# Patient Record
Sex: Male | Born: 1965 | Race: Black or African American | Hispanic: No | Marital: Married | Smoking: Never smoker
Health system: Southern US, Community
[De-identification: ages and names within clinical notes are randomized; demographics above are authoritative.]

## PROBLEM LIST (undated history)

## (undated) DIAGNOSIS — I82409 Acute embolism and thrombosis of unspecified deep veins of unspecified lower extremity: Secondary | ICD-10-CM

## (undated) DIAGNOSIS — I2699 Other pulmonary embolism without acute cor pulmonale: Secondary | ICD-10-CM

## (undated) DIAGNOSIS — I10 Essential (primary) hypertension: Secondary | ICD-10-CM

---

## 2018-01-08 ENCOUNTER — Inpatient Hospital Stay (HOSPITAL_COMMUNITY)
Admission: EM | Admit: 2018-01-08 | Discharge: 2018-01-10 | DRG: 176 | Disposition: A | Discharge status: Home / Self Care | Payer: 59 | Attending: Family Medicine | Admitting: Family Medicine

## 2018-01-08 ENCOUNTER — Emergency Department (HOSPITAL_COMMUNITY): Payer: 59

## 2018-01-08 ENCOUNTER — Other Ambulatory Visit: Payer: Self-pay

## 2018-01-08 ENCOUNTER — Encounter (HOSPITAL_COMMUNITY): Payer: Self-pay | Admitting: Emergency Medicine

## 2018-01-08 DIAGNOSIS — R000 Tachycardia, unspecified: Secondary | ICD-10-CM | POA: Diagnosis not present

## 2018-01-08 DIAGNOSIS — O2230 Deep phlebothrombosis in pregnancy, unspecified trimester: Secondary | ICD-10-CM

## 2018-01-08 DIAGNOSIS — I272 Pulmonary hypertension, unspecified: Secondary | ICD-10-CM | POA: Diagnosis present

## 2018-01-08 DIAGNOSIS — R079 Chest pain, unspecified: Secondary | ICD-10-CM | POA: Diagnosis not present

## 2018-01-08 DIAGNOSIS — R0989 Other specified symptoms and signs involving the circulatory and respiratory systems: Secondary | ICD-10-CM | POA: Diagnosis not present

## 2018-01-08 DIAGNOSIS — I2602 Saddle embolus of pulmonary artery with acute cor pulmonale: Principal | ICD-10-CM | POA: Diagnosis present

## 2018-01-08 DIAGNOSIS — I824Y9 Acute embolism and thrombosis of unspecified deep veins of unspecified proximal lower extremity: Secondary | ICD-10-CM

## 2018-01-08 DIAGNOSIS — Z8249 Family history of ischemic heart disease and other diseases of the circulatory system: Secondary | ICD-10-CM

## 2018-01-08 DIAGNOSIS — I2692 Saddle embolus of pulmonary artery without acute cor pulmonale: Secondary | ICD-10-CM | POA: Diagnosis present

## 2018-01-08 DIAGNOSIS — R0902 Hypoxemia: Secondary | ICD-10-CM | POA: Diagnosis not present

## 2018-01-08 DIAGNOSIS — I82431 Acute embolism and thrombosis of right popliteal vein: Secondary | ICD-10-CM | POA: Diagnosis present

## 2018-01-08 DIAGNOSIS — N200 Calculus of kidney: Secondary | ICD-10-CM | POA: Diagnosis not present

## 2018-01-08 DIAGNOSIS — R0789 Other chest pain: Secondary | ICD-10-CM | POA: Diagnosis not present

## 2018-01-08 LAB — I-STAT TROPONIN, ED: Troponin i, poc: 0.1 ng/mL (ref 0.00–0.08)

## 2018-01-08 LAB — I-STAT CHEM 8, ED
BUN: 18 mg/dL (ref 6–20)
CALCIUM ION: 0.98 mmol/L — AB (ref 1.15–1.40)
CHLORIDE: 106 mmol/L (ref 98–111)
Creatinine, Ser: 1.3 mg/dL — ABNORMAL HIGH (ref 0.61–1.24)
GLUCOSE: 177 mg/dL — AB (ref 70–99)
HCT: 50 % (ref 39.0–52.0)
Hemoglobin: 17 g/dL (ref 13.0–17.0)
POTASSIUM: 5 mmol/L (ref 3.5–5.1)
Sodium: 138 mmol/L (ref 135–145)
TCO2: 21 mmol/L — ABNORMAL LOW (ref 22–32)

## 2018-01-08 MED ORDER — HEPARIN BOLUS VIA INFUSION
5500.0000 [IU] | Freq: Once | INTRAVENOUS | Status: AC
  Administered 2018-01-08: 5500 [IU] via INTRAVENOUS
  Filled 2018-01-08: qty 5500

## 2018-01-08 MED ORDER — FENTANYL CITRATE (PF) 100 MCG/2ML IJ SOLN
50.0000 ug | Freq: Once | INTRAMUSCULAR | Status: AC
  Administered 2018-01-08: 50 ug via INTRAVENOUS
  Filled 2018-01-08: qty 2

## 2018-01-08 MED ORDER — NITROGLYCERIN 0.4 MG SL SUBL
0.4000 mg | SUBLINGUAL_TABLET | SUBLINGUAL | Status: DC | PRN
  Filled 2018-01-08: qty 1

## 2018-01-08 MED ORDER — IOPAMIDOL (ISOVUE-370) INJECTION 76%
INTRAVENOUS | Status: AC
  Filled 2018-01-08: qty 100

## 2018-01-08 MED ORDER — IOPAMIDOL (ISOVUE-370) INJECTION 76%
100.0000 mL | Freq: Once | INTRAVENOUS | Status: AC | PRN
  Administered 2018-01-08: 100 mL via INTRAVENOUS

## 2018-01-08 MED ORDER — HEPARIN (PORCINE) IN NACL 100-0.45 UNIT/ML-% IJ SOLN
1550.0000 [IU]/h | INTRAMUSCULAR | Status: DC
  Administered 2018-01-08: 1500 [IU]/h via INTRAVENOUS
  Administered 2018-01-09 – 2018-01-10 (×2): 1550 [IU]/h via INTRAVENOUS
  Filled 2018-01-08 (×3): qty 250

## 2018-01-08 NOTE — ED Notes (Signed)
Code stemi canceled per Dr Silverio LayYao / Dr Herbie BaltimoreHarding

## 2018-01-08 NOTE — Consult Note (Addendum)
Name: Christopher Coffey MRN: [id number] DOB: June 12, 1966    ADMISSION DATE:  01/08/2018 CONSULTATION DATE: January 08, 2018  REFERRING MD : Hospitalist service  CHIEF COMPLAINT: Lightheadedness dizziness chest pain not feeling well  BRIEF PATIENT DESCRIPTION: 52 year old with saddle submassive PE chest pain hemodynamically stable about to start on heparin unprovoked PE no family history no clear poor precipitating factors  SIGNIFICANT EVENTS  Submassive PE saddle PE with [id number] no tachycardia no hypoxia no hypotension    HISTORY OF PRESENT ILLNESS: This is a 52 year old African-American male with no past medical history presented with left-sided chest pain the patient is saying that he has baseline health status and he has a desk job for the past 3 years nothing changed in his routine no recent sedentary lifestyle limb injury or new medications weight loss weight gain fever chills rigors no nausea vomiting diarrhea and did not think much of it but today he felt like that chest pain not feeling well came into the ED and EMS truck he was briefly hypotensive and he was given 250 cc bolus saline but he got here his blood pressure was stable he does not take any medications he denies alcohol drugs or smoking.  Denies with complete gain or weight loss no family history of blood clots.  PAST MEDICAL HISTORY :   has no past medical history on file.  has no past surgical history on file. Prior to Admission medications   Not on File   No Known Allergies  FAMILY HISTORY:  family history is not on file. SOCIAL HISTORY:  reports that he has never smoked. He has never used smokeless tobacco. He reports that he drinks alcohol. He reports that he does not use drugs.  REVIEW OF SYSTEMS:   Constitutional: Negative for fever, chills, weight loss, malaise/fatigue and diaphoresis.  HENT: Negative for hearing loss, ear pain, nosebleeds, congestion, sore throat, neck pain, tinnitus and ear discharge.   Eyes:  Negative for blurred vision, double vision, photophobia, pain, discharge and redness.  Respiratory: Negative for cough, hemoptysis, sputum production, shortness of breath, wheezing and stridor.   Cardiovascular: Negative for chest pain, palpitations, orthopnea, claudication, leg swelling and PND.  Gastrointestinal: Negative for heartburn, nausea, vomiting, abdominal pain, diarrhea, constipation, blood in stool and melena.  Genitourinary: Negative for dysuria, urgency, frequency, hematuria and flank pain.  Musculoskeletal: Negative for myalgias, back pain, joint pain and falls.  Skin: Negative for itching and rash.  Neurological: Negative for dizziness, tingling, tremors, sensory change, speech change, focal weakness, seizures, loss of consciousness, weakness and headaches.  Endo/Heme/Allergies: Negative for environmental allergies and polydipsia. Does not bruise/bleed easily.  SUBJECTIVE:   VITAL SIGNS: Temp:  [97.7 F (36.5 C)] 97.7 F (36.5 C) (07/11 2218) Pulse Rate:  [94-103] 94 (07/11 2330) Resp:  [15-27] 15 (07/11 2330) BP: (119-135)/(85-97) 129/87 (07/11 2330) SpO2:  [94 %-100 %] 98 % (07/11 2330) Weight:  [210 lb (95.3 kg)] 210 lb (95.3 kg) (07/11 2214)  PHYSICAL EXAMINATION: General: Mild acute distress from pain, pleasant and hungry , wants to eat . Neuro:  WNL , AOX3 , EOMI , CN II-XII intact , UL , LL strength is symmetrical and 5/5 HEENT:  atraumatic , no jaundice , dry mucous membranes  Cardiovascular:  Irregular irregular , ESM 2/6 in the aortic area  Lungs:  CTA bilateral , no wheezing or crackles  Abdomen:  Soft lax +BS , no tenderness . Musculoskeletal:  WNL , normal pulses  Skin:  No rash  Recent Labs  Lab 01/08/18 2229  NA 138  K 5.0  CL 106  BUN 18  CREATININE 1.30*  GLUCOSE 177*   Recent Labs  Lab 01/08/18 2220 01/08/18 2229  HGB 15.7 17.0  HCT 49.9 50.0  WBC 5.8  --   PLT PENDING  --    Dg Chest Port 1 View  Result Date:  01/08/2018 CLINICAL DATA:  Acute onset crushing chest pain beginning 45 minutes ago. EXAM: PORTABLE CHEST 1 VIEW COMPARISON:  None. FINDINGS: Cardiomediastinal silhouette is normal. Pulmonary vascular congestion. No pleural effusions or focal consolidations. Trachea projects midline and there is no pneumothorax. Soft tissue planes and included osseous structures are non-suspicious. IMPRESSION: Pulmonary vascular congestion. Electronically Signed   By: Awilda Metro M.D.   On: 01/08/2018 22:31   Ct Angio Chest/abd/pel For Dissection W And/or Wo Contrast  Result Date: 01/08/2018 CLINICAL DATA:  Sudden onset crushing chest pain EXAM: CT ANGIOGRAPHY CHEST, ABDOMEN AND PELVIS TECHNIQUE: Multidetector CT imaging through the chest, abdomen and pelvis was performed using the standard protocol during bolus administration of intravenous contrast. Multiplanar reconstructed images and MIPs were obtained and reviewed to evaluate the vascular anatomy. CONTRAST:  ISOVUE-370 IOPAMIDOL (ISOVUE-370) INJECTION 76% COMPARISON:  None. FINDINGS: CTA CHEST FINDINGS Cardiovascular: Acute saddle pulmonary embolism with extensive lobar and segmental vascular narrowing. RV to LV ratio 1.1. No cardiomegaly or pericardial effusion. Normal appearance of the aorta. Mediastinum/Nodes: Negative for adenopathy Lungs/Pleura: Negative for edema or infarct Musculoskeletal: Negative Review of the MIP images confirms the above findings. CTA ABDOMEN AND PELVIS FINDINGS VASCULAR Aorta: Smooth normal appearance Celiac: Standard branching with normal appearing vessels. SMA: Smooth normal appearance Renals: Single bilateral renal arteries with earlier branching on the left. Mild undulation of the left inferior branch without convincing beading for fibromuscular dysplasia. IMA: Patent. Inflow: Normal Veins: Negative in the arterial phase Review of the MIP images confirms the above findings. NON-VASCULAR Hepatobiliary: No focal liver  abnormality.No evidence of biliary obstruction or stone. Pancreas: Unremarkable. Spleen: Unremarkable. Adrenals/Urinary Tract: Negative adrenals. Two left lower pole calculi measuring up to 4 mm. No hydronephrosis. Unremarkable bladder. Stomach/Bowel:  No obstruction. No inflammatory changes Lymphatic: No mass or adenopathy. Reproductive:No pathologic findings. Other: No ascites or pneumoperitoneum. Musculoskeletal: No acute abnormalities. Critical Value/emergent results were called by telephone at the time of interpretation on 01/08/2018 at 11:03 pm to Dr. Chaney Malling , who verbally acknowledged these results. Review of the MIP images confirms the above findings. IMPRESSION: 1. Acute saddle pulmonary embolism with right heart strain (RV to LV ratio 1.1) consistent with at least submassive (intermediate risk) PE. The presence of right heart strain has been associated with an increased risk of morbidity and mortality. Consider activation of code PE by paging [phone]. 2. No pulmonary edema or infarct. 3. Normal CTA of the aorta. 4. Left nephrolithiasis. Electronically Signed   By: Marnee Spring M.D.   On: 01/08/2018 23:16    ASSESSMENT / PLAN:  --52 year old with no previous past medical history no risk factors for PE presents with unprovoked submassive saddle PE with a transient episode of hypotension lightheadedness RV to LV ratio 1.1 no tachycardia no hypoxia no tachypnea. This is all most likely related to genetic factors I cannot see very clear provoking precipitating factor at this point I recommend to continue with medical treatment also known as heparin which was initiated by the hospitalist service will continue with that for a 24 to 48 hours and switch him to oral anticoagulation like Xarelto or  Eliquis for 6 months and have him follow-up as an outpatient with hematology and to decide if he is going to be lifelong chemotherapy or six-month therapy given that the acute severe presentation I  recommend to continue with the treatment for lifelong.  Work-up could be done when he is off the medication for 2 to 3 weeks in few months this point I do not recommend sending any of the work-up since the patient already started on heparin and he has massive clot burden which consumes clotting factors.  Echo has been ordered.   --Patient can go to stepdown unit with close follow-up.  Pulmonary and Critical Care Medicine [facility] Pager: [phone]  01/08/2018, 11:55 PM

## 2018-01-08 NOTE — ED Triage Notes (Signed)
Pt BIB [facility] with sudden onset crushing chest pain that started 45 minutes PTA. Pt reports pain radiated to left arm, back, and abdomen. Pt cool and clammy, on arrival, given 324mg  aspirin PTA. Pt c/o dull pain at this time.

## 2018-01-08 NOTE — Consult Note (Addendum)
INTERVENTIONAL CARDIOLOGY CONSULT  Reason for Consult: abnormal ECG   Requesting Physician/Service: ED YAO   PCP:  No primary care provider on file. 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  In route, ECG with ST elevations in V1 and code STEMI was activated.  Upon discussion, he notes for the last few days he has had some lightheadedness.  Today he had severe episode of lightheadedness with chest pressure.  This pressure radiated to his back to some degree and up to his shoulder blades.  In the emergency room, repeat ECG showed improvement in ST elevations in V1.  He still did have some diffuse mild ST depressions.  Given the history, he was sent to CT scan looking for aortic dissection.  However, it was revealed that he had a saddle pulmonary embolism.  Creatinine 1.3.  Cardiac markers pending.  In route, his pain symptoms improved somewhat with fluids and nitroglycerin.  After returning from the [place], he has had return of chest pain.  We repeated ECG which still looks improved from the initial one with ST elevations.    EDP is contacting critical care given large burden pulmonary embolism with hypotension and near syncope.  Previous Cardiac Studies: N/A  History reviewed. No pertinent past medical history. Possibly HTN - NOT ON MEDS  History reviewed. No pertinent surgical history.  No family history on file. He reports that his mother has HTN  Social History:  reports that he has never smoked. He has never used smokeless tobacco. He reports that he drinks alcohol. He reports that he does not use drugs.  Allergies: No Known Allergies  No current facility-administered medications on file prior to encounter.    No current outpatient medications on file  prior to encounter.  -- He does not take standing medications  Results for orders placed or performed during the hospital encounter of 01/08/18 (from the past 48 hour(s))  I-stat troponin, ED     Status: Abnormal   Collection Time: 01/08/18 10:28 PM  Result Value Ref Range   Troponin i, poc 0.10 (HH) 0.00 - 0.08 ng/mL   Comment NOTIFIED PHYSICIAN    Comment 3            Comment: Due to the release kinetics of cTnI, a negative result within the first hours of the onset of symptoms does not rule out myocardial infarction with certainty. If myocardial infarction is still suspected, repeat the test at appropriate intervals.   I-stat chem 8, ed     Status: Abnormal   Collection Time: 01/08/18 10:29 PM  Result Value Ref Range   Sodium 138 135 - 145 mmol/L   Potassium 5.0 3.5 - 5.1 mmol/L   Chloride 106 98 - 111 mmol/L   BUN 18 6 - 20 mg/dL   Creatinine, Ser 1.30 (H) 0.61 - 1.24 mg/dL   Glucose, Bld 177 (H) 70 - 99 mg/dL   Calcium, Ion 0.98 (L) 1.15 - 1.40 mmol/L   TCO2 21 (L) 22 - 32 mmol/L   Hemoglobin 17.0 13.0 - 17.0 g/dL   HCT 50.0 39.0 - 52.0 %   Dg Chest Port 1 View  Result Date: 01/08/2018 CLINICAL DATA:  Acute onset crushing chest pain beginning 45 minutes ago. EXAM: PORTABLE CHEST 1 VIEW COMPARISON:  None. FINDINGS: Cardiomediastinal silhouette is normal. Pulmonary vascular  congestion. No pleural effusions or focal consolidations. Trachea projects midline and there is no pneumothorax. Soft tissue planes and included osseous structures are non-suspicious. IMPRESSION: Pulmonary vascular congestion. Electronically Signed   By: Elon Alas M.D.   On: 01/08/2018 22:31   Ct Angio Chest/abd/pel For Dissection W And/or Wo Contrast  Result Date: 01/08/2018 CLINICAL DATA:  Sudden onset crushing chest pain EXAM: CT ANGIOGRAPHY CHEST, ABDOMEN AND PELVIS TECHNIQUE: Multidetector CT imaging through the chest, abdomen and pelvis was performed using the standard protocol during bolus  administration of intravenous contrast. Multiplanar reconstructed images and MIPs were obtained and reviewed to evaluate the vascular anatomy. CONTRAST:  124m ISOVUE-370 IOPAMIDOL (ISOVUE-370) INJECTION 76% COMPARISON:  None. FINDINGS: CTA CHEST FINDINGS Cardiovascular: Acute saddle pulmonary embolism with extensive lobar and segmental vascular narrowing. RV to LV ratio 1.1. No cardiomegaly or pericardial effusion. Normal appearance of the aorta. Mediastinum/Nodes: Negative for adenopathy Lungs/Pleura: Negative for edema or infarct Musculoskeletal: Negative Review of the MIP images confirms the above findings. CTA ABDOMEN AND PELVIS FINDINGS VASCULAR Aorta: Smooth normal appearance Celiac: Standard branching with normal appearing vessels. SMA: Smooth normal appearance Renals: Single bilateral renal arteries with earlier branching on the left. Mild undulation of the left inferior branch without convincing beading for fibromuscular dysplasia. IMA: Patent. Inflow: Normal Veins: Negative in the arterial phase Review of the MIP images confirms the above findings. NON-VASCULAR Hepatobiliary: No focal liver abnormality.No evidence of biliary obstruction or stone. Pancreas: Unremarkable. Spleen: Unremarkable. Adrenals/Urinary Tract: Negative adrenals. Two left lower pole calculi measuring up to 4 mm. No hydronephrosis. Unremarkable bladder. Stomach/Bowel:  No obstruction. No inflammatory changes Lymphatic: No mass or adenopathy. Reproductive:No pathologic findings. Other: No ascites or pneumoperitoneum. Musculoskeletal: No acute abnormalities. Critical Value/emergent results were called by telephone at the time of interpretation on 01/08/2018 at 11:03 pm to Dr. DShirlyn Goltz, who verbally acknowledged these results. Review of the MIP images confirms the above findings. IMPRESSION: 1. Acute saddle pulmonary embolism with right heart strain (RV to LV ratio 1.1) consistent with at least submassive (intermediate risk) PE. The  presence of right heart strain has been associated with an increased risk of morbidity and mortality. Consider activation of code PE by paging [phone] 2. No pulmonary edema or infarct. 3. Normal CTA of the aorta. 4. Left nephrolithiasis. Electronically Signed   By: JMonte FantasiaM.D.   On: 01/08/2018 23:16    ECG/TELE: Initial 1 and EMS with ST elevations in V1.  Subsequently sinus tachycardia with mild diffuse ST depressions without ST elevations.  ROS: As above. Otherwise, review of systems is negative unless per above HPI  Vitals:   01/08/18 2214 01/08/18 2217 01/08/18 2218 01/08/18 2230  BP:  (!) 129/92  119/87  Pulse:  99  (!) 103  Resp:  (!) 27  (!) 24  Temp:   97.7 F (36.5 C)   TempSrc:   Oral   SpO2:  100%  95%  Weight: 95.3 kg (210 lb)     Height: '6\' 1"'  (1.854 m)      Wt Readings from Last 10 Encounters:  01/08/18 95.3 kg (210 lb)    PE:  General: No acute distress (but per EMS was pale & diaphoretic with near syncope upon arrival) HEENT: Atraumatic, EOMI, mucous membranes moist. No JVD at 45 degrees. No HJR. CV: tachycardic regular no murmurs, gallops.  Respiratory: Clear, no crackles. Normal work of breathing ABD: Non-distended and non-tender. No palpable organomegaly.  Extremities: 2+ radial pulses bilaterally. 0 edema. Neuro/Psych: CN  grossly intact, alert and oriented  Assessment/Plan ECG changes Saddle Pulmonary Embolism  We are initially contacted given significant chest pain syndrome with lightheadedness and ST elevations in V1 with subtle changes in V2.  While we were certainly worried he was having dynamic ST elevations, given his history of chest pain rating to his back likely a CT scan was done which revealed a large burden pulmonary embolism.  Troponin has now resulted which is mildly elevated.  Current ECG with mild diffuse depressions and no elevations.  We agree with the emergency room contacting critical care for management.  At this point in  time, would proceed with treatment of PE.  Recommend echocardiogram looking for RV strain.  We are happy to continue to follow him as needed.  Seen with Dr. Ellyn Hack.   Lolita Cram Means  MD 01/08/2018, 11:23 PM   I have seen, examined and evaluated the patient along with Dr. Lamona Curl in the ER.  After reviewing all the available data and chart, we discussed the patients laboratory, study & physical findings as well as symptoms in detail. I agree with his findings, examination as well as impression recommendations as per our discussion.    Attending adjustments noted in italics.   Pleasant gentleman with h/o HTN who drives ~90-93 min back & forth to work daily, but no recent travel presents with sudden onset chest pain that began in the epigastric region & radiated to his back then chest.  This was associated with dyspnea, diaphoresis & near syncope with IVF responsive hypotension. He was given 325 mg ASA by EMS after initial EKG showed ~1-2 mm STE in V1 & V2 with diffuse lateral ST depression.  CODE STEMI was called - I met him in the ER.  Upon arrival to the ER, his pain had resolved & EKG normalized.  Code STEMI was cancelled, but due to his concerning presenting symptoms, Dr. Lamona Curl & myself stayed to help with evaluation & management.  In discussion with Dr. Darl Householder - we decided that ACS was still highly probable, but with radiation to the back *(in a pt with HTN) - we needed to exclude Aortic dissection.  We also needed to consider PE.  With him being CP free, we agreed to send him for CTA of the chest.  Upon returning from the CT, his pain returned to ~3/10 - repeat EKG still "normal" without STE or depression.  Prior to administering NTG, Dr. Lamona Curl reviewed the CT scan & identified what appeared to be a saddle PE.  We notified Dr. Darl Householder ([place]) who would contact Critical Care given large PE burden & concerning presentation.  Would recommend 2 D Echo to determine RV strain etc. If not abnormal, he likely  would not need further cardiology follow-up.   Glenetta Hew, M.D., M.S. Interventional Cardiologist   Pager # [phone] Phone # [phone] [address] [place], [place] [address]

## 2018-01-08 NOTE — ED Notes (Addendum)
Pt endorses that pain has gone back up to 3/10. Cardiology at bedside. Ordered a new ekg, ekg captured and given to cardiology. No ekg changes.

## 2018-01-08 NOTE — ED Provider Notes (Signed)
[facility] EMERGENCY DEPARTMENT Provider Note   CSN: [id number] Arrival date & time:        History   Chief Complaint Chief Complaint  Patient presents with  . Code STEMI    HPI Christopher Coffey is a 52 y.o. male otherwise healthy here presenting with chest pain.  Patient states that he had acute onset of substernal chest pain radiating to his back just prior to arrival.  He states that it was 10 out of 10 and he initially wanted come by private vehicle.  However, patient called his wife and his wife called EMS.  He was noted to be diaphoretic and hypotensive with a blood pressure in the 90s initially.  His initial EKG showed a possible ST elevation in V1 and aVR.  Code STEMI was activated by EMS.  Patient was given aspirin 325 by EMS.  No nitro was given due to hypotension and he was also given 1 L NS bolus prior to arrival.  He denies any previous cardiac history.  He never had a stress test and denies any recent travels.   The history is provided by the patient.    History reviewed. No pertinent past medical history.  There are no active problems to display for this patient.   History reviewed. No pertinent surgical history.      Home Medications    Prior to Admission medications   Not on File    Family History No family history on file.  Social History Social History   Tobacco Use  . Smoking status: Never Smoker  . Smokeless tobacco: Never Used  Substance Use Topics  . Alcohol use: Yes  . Drug use: Never     Allergies   Patient has no known allergies.   Review of Systems Review of Systems  Cardiovascular: Positive for chest pain.  All other systems reviewed and are negative.    Physical Exam Updated Vital Signs BP 119/87   Pulse (!) 103   Temp 97.7 F (36.5 C) (Oral)   Resp (!) 24   Ht 6\' 1"  (1.854 m)   Wt 95.3 kg (210 lb)   SpO2 95%   BMI 27.71 kg/m   Physical Exam  Constitutional: He is oriented to person, place, and  time.  Uncomfortable   HENT:  Head: Normocephalic.  Mouth/Throat: Oropharynx is clear and moist.  Eyes: Pupils are equal, round, and reactive to light. Conjunctivae and EOM are normal.  Neck: Normal range of motion. Neck supple.  Cardiovascular: Normal rate, regular rhythm and normal heart sounds.  Pulmonary/Chest: Effort normal and breath sounds normal. No stridor. No respiratory distress. He has no wheezes.  Abdominal: Soft. Bowel sounds are normal. He exhibits no distension. There is no tenderness.  Musculoskeletal:  Good peripheral pulses in all extremities   Neurological: He is alert and oriented to person, place, and time.  Skin: Skin is warm.  Psychiatric: He has a normal mood and affect.  Nursing note and vitals reviewed.    ED Treatments / Results  Labs (all labs ordered are listed, but only abnormal results are displayed) Labs Reviewed  CBC WITH DIFFERENTIAL/PLATELET - Abnormal; Notable for the following components:      Result Value   RDW 16.8 (*)    All other components within normal limits  I-STAT TROPONIN, ED - Abnormal; Notable for the following components:   Troponin i, poc 0.10 (*)    All other components within normal limits  I-STAT CHEM 8, ED -  Abnormal; Notable for the following components:   Creatinine, Ser 1.30 (*)    Glucose, Bld 177 (*)    Calcium, Ion 0.98 (*)    TCO2 21 (*)    All other components within normal limits  COMPREHENSIVE METABOLIC PANEL  LIPASE, BLOOD    EKG EKG Interpretation  Date/Time:  Thursday January 08 2018 22:13:55 EDT Ventricular Rate:  101 PR Interval:    QRS Duration: 85 QT Interval:  345 QTC Calculation: 448 R Axis:   42 Text Interpretation:  Sinus tachycardia Borderline repolarization abnormality No previous ECGs available Confirmed by Richardean CanalYao, Rilya Longo H ([id number]) on 01/08/2018 10:26:00 PM   Radiology Dg Chest Port 1 View  Result Date: 01/08/2018 CLINICAL DATA:  Acute onset crushing chest pain beginning 45 minutes ago.  EXAM: PORTABLE CHEST 1 VIEW COMPARISON:  None. FINDINGS: Cardiomediastinal silhouette is normal. Pulmonary vascular congestion. No pleural effusions or focal consolidations. Trachea projects midline and there is no pneumothorax. Soft tissue planes and included osseous structures are non-suspicious. IMPRESSION: Pulmonary vascular congestion. Electronically Signed   By: Awilda Metroourtnay  Bloomer M.D.   On: 01/08/2018 22:31   Ct Angio Chest/abd/pel For Dissection W And/or Wo Contrast  Result Date: 01/08/2018 CLINICAL DATA:  Sudden onset crushing chest pain EXAM: CT ANGIOGRAPHY CHEST, ABDOMEN AND PELVIS TECHNIQUE: Multidetector CT imaging through the chest, abdomen and pelvis was performed using the standard protocol during bolus administration of intravenous contrast. Multiplanar reconstructed images and MIPs were obtained and reviewed to evaluate the vascular anatomy. CONTRAST:  100mL ISOVUE-370 IOPAMIDOL (ISOVUE-370) INJECTION 76% COMPARISON:  None. FINDINGS: CTA CHEST FINDINGS Cardiovascular: Acute saddle pulmonary embolism with extensive lobar and segmental vascular narrowing. RV to LV ratio 1.1. No cardiomegaly or pericardial effusion. Normal appearance of the aorta. Mediastinum/Nodes: Negative for adenopathy Lungs/Pleura: Negative for edema or infarct Musculoskeletal: Negative Review of the MIP images confirms the above findings. CTA ABDOMEN AND PELVIS FINDINGS VASCULAR Aorta: Smooth normal appearance Celiac: Standard branching with normal appearing vessels. SMA: Smooth normal appearance Renals: Single bilateral renal arteries with earlier branching on the left. Mild undulation of the left inferior branch without convincing beading for fibromuscular dysplasia. IMA: Patent. Inflow: Normal Veins: Negative in the arterial phase Review of the MIP images confirms the above findings. NON-VASCULAR Hepatobiliary: No focal liver abnormality.No evidence of biliary obstruction or stone. Pancreas: Unremarkable. Spleen:  Unremarkable. Adrenals/Urinary Tract: Negative adrenals. Two left lower pole calculi measuring up to 4 mm. No hydronephrosis. Unremarkable bladder. Stomach/Bowel:  No obstruction. No inflammatory changes Lymphatic: No mass or adenopathy. Reproductive:No pathologic findings. Other: No ascites or pneumoperitoneum. Musculoskeletal: No acute abnormalities. Critical Value/emergent results were called by telephone at the time of interpretation on 01/08/2018 at 11:03 pm to Dr. Chaney MallingAVID Gayleen Sholtz , who verbally acknowledged these results. Review of the MIP images confirms the above findings. IMPRESSION: 1. Acute saddle pulmonary embolism with right heart strain (RV to LV ratio 1.1) consistent with at least submassive (intermediate risk) PE. The presence of right heart strain has been associated with an increased risk of morbidity and mortality. Consider activation of code PE by paging [phone]. 2. No pulmonary edema or infarct. 3. Normal CTA of the aorta. 4. Left nephrolithiasis. Electronically Signed   By: Marnee SpringJonathon  Watts M.D.   On: 01/08/2018 23:16    Procedures Procedures (including critical care time)  CRITICAL CARE Performed by: Richardean Canalavid H Zachry Hopfensperger   Total critical care time: 30 minutes  Critical care time was exclusive of separately billable procedures and treating other patients.  Critical care was necessary  to treat or prevent imminent or life-threatening deterioration.  Critical care was time spent personally by me on the following activities: development of treatment plan with patient and/or surrogate as well as nursing, discussions with consultants, evaluation of patient's response to treatment, examination of patient, obtaining history from patient or surrogate, ordering and performing treatments and interventions, ordering and review of laboratory studies, ordering and review of radiographic studies, pulse oximetry and re-evaluation of patient's condition.   Medications Ordered in ED Medications    iopamidol (ISOVUE-370) 76 % injection (has no administration in time range)  nitroGLYCERIN (NITROSTAT) SL tablet 0.4 mg (has no administration in time range)  heparin bolus via infusion 5,500 Units (has no administration in time range)  heparin ADULT infusion 100 units/mL (25000 units/224mL sodium chloride 0.45%) (has no administration in time range)  iopamidol (ISOVUE-370) 76 % injection 100 mL (100 mLs Intravenous Contrast Given 01/08/18 2245)  fentaNYL (SUBLIMAZE) injection 50 mcg (50 mcg Intravenous Given 01/08/18 2312)     Initial Impression / Assessment and Plan / ED Course  I have reviewed the triage vital signs and the nursing notes.  Pertinent labs & imaging results that were available during my care of the patient were reviewed by me and considered in my medical decision making (see chart for details).     Mickell Birdwell is a 52 y.o. male here with chest pain, back pain, hypotension. Code STEMI was activated by EMS. However, he had multiple EKGs by EMS later that didn't show STEMI. I think likely has dynamic changes which makes me concerned for unstable angina or NSTEMI. Dr. Herbie Baltimore at bedside and EKG in the ED showed no STEMI so code STEMI canceled. Will get dissection study. Cardiology to start heparin if he has no dissection.   11:39 PM CTA showed no dissection. But he has saddle embolus with right heart strain. Trop positive at 0.1. I started heparin drip. I called Dr. Darrick Penna from critical care. She reviewed Code PE protocol. Since patient is not hypotensive currently and not hypoxic, patient doesn't need ICU. Critical care will consult but recommend stepdown admission. Hospitalist to admit.   Final Clinical Impressions(s) / ED Diagnoses   Final diagnoses:  Acute saddle pulmonary embolism with acute [facility] pulmonale [facility])    ED Discharge Orders    None       Charlynne Pander, MD 01/08/18 2339

## 2018-01-08 NOTE — Progress Notes (Signed)
ANTICOAGULATION CONSULT NOTE - Initial Consult  Pharmacy Consult for Heparin Indication: pulmonary embolus  No Known Allergies  Patient Measurements: Height: 6\' 1"  (185.4 cm) Weight: 210 lb (95.3 kg) IBW/kg (Calculated) : 79.9 Heparin Dosing Weight: 95 kg  Vital Signs: Temp: 97.7 F (36.5 C) (07/11 2218) Temp Source: Oral (07/11 2218) BP: 119/87 (07/11 2230) Pulse Rate: 103 (07/11 2230)  Labs: Recent Labs    01/08/18 2229  HGB 17.0  HCT 50.0  CREATININE 1.30*    Estimated Creatinine Clearance: 75.1 mL/min (A) (by C-G formula based on SCr of 1.3 mg/dL (H)).   Medical History: History reviewed. No pertinent past medical history.  Assessment: [age] YOM who [facility] CP originally with concern for STEMI however now with concern for PE and to start Heparin for anticoagulation.   Goal of Therapy:  Heparin level 0.3-0.7 units/ml Monitor platelets by anticoagulation protocol: Yes   Plan:  - Heparin 5500 units bolus x 1 followed by 1500 units/hr (15 ml/hr)  - Daily HL, CBC - Will continue to monitor for any signs/symptoms of bleeding and will follow up with heparin level in 6 hours   Thank you for allowing pharmacy to be a part of this patient's care.  Georgina PillionElizabeth Shan Valdes, PharmD, BCPS Clinical Pharmacist Pager: [phone] If after 3:30p, please call main pharmacy at: [phone] 01/08/2018 11:24 PM

## 2018-01-09 ENCOUNTER — Inpatient Hospital Stay (HOSPITAL_COMMUNITY): Payer: 59

## 2018-01-09 ENCOUNTER — Other Ambulatory Visit: Payer: Self-pay

## 2018-01-09 DIAGNOSIS — I361 Nonrheumatic tricuspid (valve) insufficiency: Secondary | ICD-10-CM | POA: Diagnosis not present

## 2018-01-09 DIAGNOSIS — R9431 Abnormal electrocardiogram [ECG] [EKG]: Secondary | ICD-10-CM | POA: Diagnosis not present

## 2018-01-09 DIAGNOSIS — I824Y9 Acute embolism and thrombosis of unspecified deep veins of unspecified proximal lower extremity: Secondary | ICD-10-CM | POA: Diagnosis not present

## 2018-01-09 DIAGNOSIS — I824Y1 Acute embolism and thrombosis of unspecified deep veins of right proximal lower extremity: Secondary | ICD-10-CM | POA: Diagnosis not present

## 2018-01-09 DIAGNOSIS — I371 Nonrheumatic pulmonary valve insufficiency: Secondary | ICD-10-CM | POA: Diagnosis not present

## 2018-01-09 DIAGNOSIS — Z8249 Family history of ischemic heart disease and other diseases of the circulatory system: Secondary | ICD-10-CM | POA: Diagnosis not present

## 2018-01-09 DIAGNOSIS — I2692 Saddle embolus of pulmonary artery without acute cor pulmonale: Secondary | ICD-10-CM | POA: Diagnosis present

## 2018-01-09 DIAGNOSIS — I82431 Acute embolism and thrombosis of right popliteal vein: Secondary | ICD-10-CM | POA: Diagnosis not present

## 2018-01-09 DIAGNOSIS — R079 Chest pain, unspecified: Secondary | ICD-10-CM | POA: Diagnosis present

## 2018-01-09 DIAGNOSIS — I2699 Other pulmonary embolism without acute cor pulmonale: Secondary | ICD-10-CM | POA: Diagnosis not present

## 2018-01-09 DIAGNOSIS — I2602 Saddle embolus of pulmonary artery with acute cor pulmonale: Secondary | ICD-10-CM | POA: Diagnosis not present

## 2018-01-09 DIAGNOSIS — I272 Pulmonary hypertension, unspecified: Secondary | ICD-10-CM | POA: Diagnosis not present

## 2018-01-09 DIAGNOSIS — R0902 Hypoxemia: Secondary | ICD-10-CM | POA: Diagnosis not present

## 2018-01-09 LAB — CBC WITH DIFFERENTIAL/PLATELET
Abs Immature Granulocytes: 0.1 10*3/uL (ref 0.0–0.1)
BASOS PCT: 1 %
Basophils Absolute: 0 10*3/uL (ref 0.0–0.1)
EOS ABS: 0.1 10*3/uL (ref 0.0–0.7)
EOS PCT: 2 %
HEMATOCRIT: 49.9 % (ref 39.0–52.0)
Hemoglobin: 15.7 g/dL (ref 13.0–17.0)
Immature Granulocytes: 1 %
LYMPHS ABS: 2.3 10*3/uL (ref 0.7–4.0)
Lymphocytes Relative: 40 %
MCH: 27.4 pg (ref 26.0–34.0)
MCHC: 31.5 g/dL (ref 30.0–36.0)
MCV: 87.1 fL (ref 78.0–100.0)
MONO ABS: 0.6 10*3/uL (ref 0.1–1.0)
MONOS PCT: 10 %
Neutro Abs: 2.7 10*3/uL (ref 1.7–7.7)
Neutrophils Relative %: 46 %
PLATELETS: 78 10*3/uL — AB (ref 150–400)
RBC: 5.73 MIL/uL (ref 4.22–5.81)
RDW: 16.8 % — AB (ref 11.5–15.5)
WBC: 5.8 10*3/uL (ref 4.0–10.5)

## 2018-01-09 LAB — COMPREHENSIVE METABOLIC PANEL
ALK PHOS: 72 U/L (ref 38–126)
ALT: 124 U/L — AB (ref 0–44)
AST: 157 U/L — AB (ref 15–41)
Albumin: 3.7 g/dL (ref 3.5–5.0)
Anion gap: 13 (ref 5–15)
BILIRUBIN TOTAL: 0.7 mg/dL (ref 0.3–1.2)
BUN: 12 mg/dL (ref 6–20)
CALCIUM: 8.9 mg/dL (ref 8.9–10.3)
CO2: 23 mmol/L (ref 22–32)
CREATININE: 1.38 mg/dL — AB (ref 0.61–1.24)
Chloride: 104 mmol/L (ref 98–111)
GFR calc non Af Amer: 57 mL/min — ABNORMAL LOW (ref >60)
Glucose, Bld: 155 mg/dL — ABNORMAL HIGH (ref 70–99)
Potassium: 4.3 mmol/L (ref 3.5–5.1)
Sodium: 140 mmol/L (ref 135–145)
TOTAL PROTEIN: 7.1 g/dL (ref 6.5–8.1)

## 2018-01-09 LAB — TROPONIN I
TROPONIN I: 2.03 ng/mL — AB (ref <0.03)
Troponin I: 1.25 ng/mL (ref <0.03)
Troponin I: 2.02 ng/mL (ref <0.03)

## 2018-01-09 LAB — CBC
HEMATOCRIT: 46.3 % (ref 39.0–52.0)
Hemoglobin: 15.2 g/dL (ref 13.0–17.0)
MCH: 27.4 pg (ref 26.0–34.0)
MCHC: 32.8 g/dL (ref 30.0–36.0)
MCV: 83.6 fL (ref 78.0–100.0)
PLATELETS: 84 10*3/uL — AB (ref 150–400)
RBC: 5.54 MIL/uL (ref 4.22–5.81)
RDW: 15.9 % — AB (ref 11.5–15.5)
WBC: 6.9 10*3/uL (ref 4.0–10.5)

## 2018-01-09 LAB — HIV ANTIBODY (ROUTINE TESTING W REFLEX): HIV Screen 4th Generation wRfx: NONREACTIVE

## 2018-01-09 LAB — HEPARIN LEVEL (UNFRACTIONATED)
Heparin Unfractionated: 0.36 IU/mL (ref 0.30–0.70)
Heparin Unfractionated: 0.36 IU/mL (ref 0.30–0.70)

## 2018-01-09 LAB — MRSA PCR SCREENING: MRSA by PCR: NEGATIVE

## 2018-01-09 LAB — ECHOCARDIOGRAM COMPLETE
Height: 73 in
Weight: 3740.8 oz

## 2018-01-09 LAB — LIPASE, BLOOD: Lipase: 36 U/L (ref 11–51)

## 2018-01-09 MED ORDER — ACETAMINOPHEN 325 MG PO TABS: 650.0000 mg | ORAL_TABLET | Freq: Four times a day (QID) | ORAL | Status: DC | PRN

## 2018-01-09 MED ORDER — ACETAMINOPHEN 650 MG RE SUPP: 650.0000 mg | Freq: Four times a day (QID) | RECTAL | Status: DC | PRN

## 2018-01-09 MED ORDER — MORPHINE SULFATE (PF) 4 MG/ML IV SOLN: 2.0000 mg | INTRAVENOUS | Status: DC | PRN

## 2018-01-09 MED ORDER — ONDANSETRON HCL 4 MG PO TABS: 4.0000 mg | ORAL_TABLET | Freq: Four times a day (QID) | ORAL | Status: DC | PRN

## 2018-01-09 MED ORDER — ONDANSETRON HCL 4 MG/2ML IJ SOLN: 4.0000 mg | Freq: Four times a day (QID) | INTRAMUSCULAR | Status: DC | PRN

## 2018-01-09 NOTE — Progress Notes (Signed)
Troponin resulted at 1.25, patient has denied chest pain since arrival to unit.  Currently has IV heparin infusing, RN text paged [facility] with this information.

## 2018-01-09 NOTE — Progress Notes (Signed)
*  Preliminary Results* Bilateral lower extremity venous duplex completed. Right lower extremity is positive for deep vein thrombosis in the popliteal vein and peroneal veins. Left lower extremity is negative for deep vein thrombosis. There is no evidence of Baker's cyst bilaterally.  01/09/2018 3:51 PM Christopher Coffey Clare Gandyiddle

## 2018-01-09 NOTE — Care Management Note (Signed)
Case Management Note  Patient Details  Name: Christopher Coffey MRN: [id number] Date of Birth: May 15, 1966  Subjective/Objective:       Pulmonary embolus         Benefits check in process:   1.Eliquis 10mg  twice a day x 7 days , then 5mg  twice a day .   2. Xarelto 15 mg twice a day x21 days, then 20 mg everyday.   NCM to f/u with results, and provide pt with 30 day free and copay cards on medication MD orders.   Action/Plan: Transition to home when medically stable...  Pt without PCP, post hospital f/u / establish primary care, NCM scheduled an appointment for 01/23/2108 at 1pm with Dr.Cory Nafziger., [place]. NCM noted on AVS. NCM made pt .  Expected Discharge Date:                  Expected Discharge Plan:  Home/Self Care  In-House Referral:     Discharge planning Services  Follow-up appt scheduled, CM Consult  Post Acute Care Choice:    Choice offered to:     DME Arranged:    DME Agency:     HH Arranged:    HH Agency:     Status of Service:  In process  If discussed at Long Length of Stay Meetings, dates discussed:    Additional Comments:  Epifanio LeschesCole, Kadence Mimbs [place], RN 01/09/2018, 11:34 AM

## 2018-01-09 NOTE — Progress Notes (Signed)
  Echocardiogram 2D Echocardiogram has been performed.  Christopher Coffey  Christopher Coffey 01/09/2018, 1:48 PM

## 2018-01-09 NOTE — Progress Notes (Signed)
Jesse FallGreenlee, Dora  Grantham Hippert H, RN        # [id number]  S/W GAIL @ [employer] # [phone]   ELIQUIS 10 MG: NONE FORMULARY   1. ELIQUIS  2.5 MG BID  COVER- YES  CO-PAY- $ 150.00 Q/L TWO PILL PER DAY  TIER- 4 DRUG  PRIOR APPROVAL- NO   2. ELIQUIS   5 MG  COVER- YES  CO-PAY- $ 150.00  Q/L TWO PILL PER DAY  TIER- 4 DRUG  PRIOR APPROVAL- NO    3. XARELTO 15 MG BID  COVER- YES  CO-PAY- $ 35.00  Q/L ONE PILL PER DAYS  TIER - 2 DRUG  PRIOR APPROVAL- YES # [phone] FOR EXCEPTION    4. XARELTO 20 MG DAILY  COVER- YES  CO-PAY- $ 35.00  Q/L ONE PILL PER DAY  TIER- 2 DRUG  PRIOR APPROVAL- NO   PREFERRED PHARMACY : YES [facility], [facility] AND [facility]   Previous Messages    ----- Message -----  From: Jacinta Shoeole, Milla Wahlberg H, RN  Sent: 01/09/2018 11:44 AM  To: Ruel Favorshl Ip Ccm Case Mgr Assistant  Subject: benefits checks                    Gae GallopAngela Latisa Belay RN,BSN,CM

## 2018-01-09 NOTE — H&P (Signed)
History and Physical    Debera LatMaurice Hubner NWG:[id number] DOB: Oct 31, 1965 DOA: 01/08/2018  PCP: No primary care provider on file.  Patient coming from: Home  I [facility] personally briefly [facility]  Chief Complaint: CP  HPI: Debera LatMaurice Perlman is a 52 y.o. male with medical history significant of previously healthy.  Presents to the ED with c/o chest pain and lightheadedness.  Symptoms onset suddenly PTA.  Crushing CP.  Radiates to L arm, back and abdomen.   ED Course: Initially they thought patient might be a STEMI so code STEMI was called.  Once STEMI was ruled out, CT dissection study was ordered.  He does not have aortic dissection; however, CT scan did reveal the cause of his pain to be a large saddle PE!  Trop 0.10.   Review of Systems: As per HPI otherwise 10 point review of systems negative.   History reviewed. No pertinent past medical history.  History reviewed. No pertinent surgical history.   reports that he has never smoked. He has never used smokeless tobacco. He reports that he drinks alcohol. He reports that he does not use drugs.  No Known Allergies  No family history on file.   Prior to Admission medications   Not on File    Physical Exam: Vitals:   01/09/18 0000 01/09/18 0015 01/09/18 0030 01/09/18 0045  BP: (!) 125/93 125/85 (!) 132/93   Pulse: 88 95 89 91  Resp: 12 (!) 21 15 14   Temp:      TempSrc:      SpO2: 99% 96% 93% 95%  Weight:      Height:        Constitutional: NAD, calm, comfortable Eyes: PERRL, lids and conjunctivae normal ENMT: Mucous membranes are moist. Posterior pharynx clear of any exudate or lesions.Normal dentition.  Neck: normal, supple, no masses, no thyromegaly Respiratory: clear to auscultation bilaterally, no wheezing, no crackles. Normal respiratory effort. No accessory muscle use.  Cardiovascular: Regular rate and rhythm, no murmurs / rubs / gallops. No extremity edema. 2+ pedal  pulses. No carotid bruits.  Abdomen: no tenderness, no masses palpated. No hepatosplenomegaly. Bowel sounds positive.  Musculoskeletal: no clubbing / cyanosis. No joint deformity upper and lower extremities. Good ROM, no contractures. Normal muscle tone.  Skin: no rashes, lesions, ulcers. No induration Neurologic: CN 2-12 grossly intact. Sensation intact, DTR normal. Strength 5/5 in all 4.  Psychiatric: Normal judgment and insight. Alert and oriented x 3. Normal mood.    Labs on Admission: I have personally reviewed following labs and imaging studies  CBC: Recent Labs  Lab 01/08/18 2220 01/08/18 2229  WBC 5.8  --   NEUTROABS 2.7  --   HGB 15.7 17.0  HCT 49.9 50.0  MCV 87.1  --   PLT 78*  --    Basic Metabolic Panel: Recent Labs  Lab 01/08/18 2229  NA 138  K 5.0  CL 106  GLUCOSE 177*  BUN 18  CREATININE 1.30*   GFR: Estimated Creatinine Clearance: 75.1 mL/min (A) (by C-G formula based on SCr of 1.3 mg/dL (H)). Liver Function Tests: No results for input(s): AST, ALT, ALKPHOS, BILITOT, PROT, ALBUMIN in the last 168 hours. No results for input(s): LIPASE, AMYLASE in the last 168 hours. No results for input(s): AMMONIA in the last 168 hours. Coagulation Profile: No results for input(s): INR, PROTIME in the last 168 hours. Cardiac Enzymes: No results for input(s): CKTOTAL, CKMB, CKMBINDEX, TROPONINI in the last 168 hours.  BNP (last 3 results) No results for input(s): PROBNP in the last 8760 hours. HbA1C: No results for input(s): HGBA1C in the last 72 hours. CBG: No results for input(s): GLUCAP in the last 168 hours. Lipid Profile: No results for input(s): CHOL, HDL, LDLCALC, TRIG, CHOLHDL, LDLDIRECT in the last 72 hours. Thyroid Function Tests: No results for input(s): TSH, T4TOTAL, FREET4, T3FREE, THYROIDAB in the last 72 hours. Anemia Panel: No results for input(s): VITAMINB12, FOLATE, FERRITIN, TIBC, IRON, RETICCTPCT in the last 72 hours. Urine analysis: No  results found for: COLORURINE, APPEARANCEUR, LABSPEC, PHURINE, GLUCOSEU, HGBUR, BILIRUBINUR, KETONESUR, PROTEINUR, UROBILINOGEN, NITRITE, LEUKOCYTESUR  Radiological Exams on Admission: Dg Chest Port 1 View  Result Date: 01/08/2018 CLINICAL DATA:  Acute onset crushing chest pain beginning 45 minutes ago. EXAM: PORTABLE CHEST 1 VIEW COMPARISON:  None. FINDINGS: Cardiomediastinal silhouette is normal. Pulmonary vascular congestion. No pleural effusions or focal consolidations. Trachea projects midline and there is no pneumothorax. Soft tissue planes and included osseous structures are non-suspicious. IMPRESSION: Pulmonary vascular congestion. Electronically Signed   By: Awilda Metro M.D.   On: 01/08/2018 22:31   Ct Angio Chest/abd/pel For Dissection W And/or Wo Contrast  Result Date: 01/08/2018 CLINICAL DATA:  Sudden onset crushing chest pain EXAM: CT ANGIOGRAPHY CHEST, ABDOMEN AND PELVIS TECHNIQUE: Multidetector CT imaging through the chest, abdomen and pelvis was performed using the standard protocol during bolus administration of intravenous contrast. Multiplanar reconstructed images and MIPs were obtained and reviewed to evaluate the vascular anatomy. CONTRAST:  ISOVUE-370 IOPAMIDOL (ISOVUE-370) INJECTION 76% COMPARISON:  None. FINDINGS: CTA CHEST FINDINGS Cardiovascular: Acute saddle pulmonary embolism with extensive lobar and segmental vascular narrowing. RV to LV ratio 1.1. No cardiomegaly or pericardial effusion. Normal appearance of the aorta. Mediastinum/Nodes: Negative for adenopathy Lungs/Pleura: Negative for edema or infarct Musculoskeletal: Negative Review of the MIP images confirms the above findings. CTA ABDOMEN AND PELVIS FINDINGS VASCULAR Aorta: Smooth normal appearance Celiac: Standard branching with normal appearing vessels. SMA: Smooth normal appearance Renals: Single bilateral renal arteries with earlier branching on the left. Mild undulation of the left inferior branch without  convincing beading for fibromuscular dysplasia. IMA: Patent. Inflow: Normal Veins: Negative in the arterial phase Review of the MIP images confirms the above findings. NON-VASCULAR Hepatobiliary: No focal liver abnormality.No evidence of biliary obstruction or stone. Pancreas: Unremarkable. Spleen: Unremarkable. Adrenals/Urinary Tract: Negative adrenals. Two left lower pole calculi measuring up to 4 mm. No hydronephrosis. Unremarkable bladder. Stomach/Bowel:  No obstruction. No inflammatory changes Lymphatic: No mass or adenopathy. Reproductive:No pathologic findings. Other: No ascites or pneumoperitoneum. Musculoskeletal: No acute abnormalities. Critical Value/emergent results were called by telephone at the time of interpretation on 01/08/2018 at 11:03 pm to Dr. Chaney Malling , who verbally acknowledged these results. Review of the MIP images confirms the above findings. IMPRESSION: 1. Acute saddle pulmonary embolism with right heart strain (RV to LV ratio 1.1) consistent with at least submassive (intermediate risk) PE. The presence of right heart strain has been associated with an increased risk of morbidity and mortality. Consider activation of code PE by paging [phone]. 2. No pulmonary edema or infarct. 3. Normal CTA of the aorta. 4. Left nephrolithiasis. Electronically Signed   By: Marnee Spring M.D.   On: 01/08/2018 23:16    EKG: Independently reviewed.  Assessment/Plan Active Problems:   Acute saddle pulmonary embolism (HCC)    1. Acute saddle PE - 1. Heparin gtt started in ED 2. Convert to POs in a couple of days 3. Serial trops 4. Tele monitor  5. Pulm consulted (see their note), no indication for TPA currently 6. 2d echo 7. BLE venous duplex  DVT prophylaxis: Heparin gtt Code Status: Full Family Communication: Wife at bedside Disposition Plan: Home after admit Consults called: Pulm, note in chart Admission status: Admit to inpatient   Hillary Bow DO [facility] Pager [phone] Only works nights!  If 7AM-7PM, please contact the primary day team physician taking care of patient  [email or web address] Password [facility]  01/09/2018, 12:53 AM

## 2018-01-09 NOTE — Progress Notes (Signed)
Progress Note  Patient Name: Christopher Coffey Date of Encounter: 01/09/2018  Primary Cardiologist: No primary care provider on file.   Subjective   CP and SOB have resolved. No new complaints this morning. Denies prior cardiac history and wife reports that he does not follow with a doctor regularly.   Inpatient Medications    Scheduled Meds: . iopamidol       Continuous Infusions: . heparin 1,550 Units/hr (01/09/18 0933)   PRN Meds: acetaminophen **OR** acetaminophen, morphine injection, nitroGLYCERIN, ondansetron **OR** ondansetron (ZOFRAN) IV   Vital Signs    Vitals:   01/09/18 0145 01/09/18 0247 01/09/18 0505 01/09/18 0814  BP: 135/89 (!) 134/91 124/87 119/74  Pulse: 97 96 93 82  Resp: (!) 21 (!) 25 18 20   Temp:  98.4 F (36.9 C) 98.8 F (37.1 C) 98.5 F (36.9 C)  TempSrc:  Oral Oral Oral  SpO2: 94% 97% 95% 95%  Weight:  233 lb 12.8 oz (106.1 kg)    Height:  6\' 1"  (1.854 m)      Intake/Output Summary (Last 24 hours) at 01/09/2018 1008 Last data filed at 01/09/2018 0046 Gross per 24 hour  Intake 69.04 ml  Output -  Net 69.04 ml   Filed Weights   01/08/18 2214 01/09/18 0247  Weight: 210 lb (95.3 kg) 233 lb 12.8 oz (106.1 kg)    Telemetry    NSR - Personally Reviewed  Physical Exam   GEN: Laying in bed in no acute distress.   Neck: No JVD, no carotid bruits Cardiac: RRR, no murmurs, rubs, or gallops.  Respiratory: Clear to auscultation bilaterally, no wheezes/ rales/ rhonchi GI: NABS, Soft, nontender, non-distended  MS: No edema; No deformity. Neuro:  Nonfocal, moving all extremities spontaneously Psych: Normal affect   Labs    Chemistry Recent Labs  Lab 01/08/18 2229 01/09/18 0201  NA 138 140  K 5.0 4.3  CL 106 104  CO2  --  23  GLUCOSE 177* 155*  BUN 18 12  CREATININE 1.30* 1.38*  CALCIUM  --  8.9  PROT  --  7.1  ALBUMIN  --  3.7  AST  --  157*  ALT  --  124*  ALKPHOS  --  72  BILITOT  --  0.7  GFRNONAA  --  57*  GFRAA  --  >60    ANIONGAP  --  13     Hematology Recent Labs  Lab 01/08/18 2220 01/08/18 2229 01/09/18 0605  WBC 5.8  --  6.9  RBC 5.73  --  5.54  HGB 15.7 17.0 15.2  HCT 49.9 50.0 46.3  MCV 87.1  --  83.6  MCH 27.4  --  27.4  MCHC 31.5  --  32.8  RDW 16.8*  --  15.9*  PLT 78*  --  84*    Cardiac Enzymes Recent Labs  Lab 01/09/18 0201 01/09/18 0605  TROPONINI 1.25* 2.02*    Recent Labs  Lab 01/08/18 2228  TROPIPOC 0.10*     BNPNo results for input(s): BNP, PROBNP in the last 168 hours.   DDimer No results for input(s): DDIMER in the last 168 hours.   Radiology    Dg Chest Port 1 View  Result Date: 01/08/2018 CLINICAL DATA:  Acute onset crushing chest pain beginning 45 minutes ago. EXAM: PORTABLE CHEST 1 VIEW COMPARISON:  None. FINDINGS: Cardiomediastinal silhouette is normal. Pulmonary vascular congestion. No pleural effusions or focal consolidations. Trachea projects midline and there is no pneumothorax. Soft tissue planes  and included osseous structures are non-suspicious. IMPRESSION: Pulmonary vascular congestion. Electronically Signed   By: Awilda Metro M.D.   On: 01/08/2018 22:31   Ct Angio Chest/abd/pel For Dissection W And/or Wo Contrast  Result Date: 01/08/2018 CLINICAL DATA:  Sudden onset crushing chest pain EXAM: CT ANGIOGRAPHY CHEST, ABDOMEN AND PELVIS TECHNIQUE: Multidetector CT imaging through the chest, abdomen and pelvis was performed using the standard protocol during bolus administration of intravenous contrast. Multiplanar reconstructed images and MIPs were obtained and reviewed to evaluate the vascular anatomy. CONTRAST:  ISOVUE-370 IOPAMIDOL (ISOVUE-370) INJECTION 76% COMPARISON:  None. FINDINGS: CTA CHEST FINDINGS Cardiovascular: Acute saddle pulmonary embolism with extensive lobar and segmental vascular narrowing. RV to LV ratio 1.1. No cardiomegaly or pericardial effusion. Normal appearance of the aorta. Mediastinum/Nodes: Negative for adenopathy  Lungs/Pleura: Negative for edema or infarct Musculoskeletal: Negative Review of the MIP images confirms the above findings. CTA ABDOMEN AND PELVIS FINDINGS VASCULAR Aorta: Smooth normal appearance Celiac: Standard branching with normal appearing vessels. SMA: Smooth normal appearance Renals: Single bilateral renal arteries with earlier branching on the left. Mild undulation of the left inferior branch without convincing beading for fibromuscular dysplasia. IMA: Patent. Inflow: Normal Veins: Negative in the arterial phase Review of the MIP images confirms the above findings. NON-VASCULAR Hepatobiliary: No focal liver abnormality.No evidence of biliary obstruction or stone. Pancreas: Unremarkable. Spleen: Unremarkable. Adrenals/Urinary Tract: Negative adrenals. Two left lower pole calculi measuring up to 4 mm. No hydronephrosis. Unremarkable bladder. Stomach/Bowel:  No obstruction. No inflammatory changes Lymphatic: No mass or adenopathy. Reproductive:No pathologic findings. Other: No ascites or pneumoperitoneum. Musculoskeletal: No acute abnormalities. Critical Value/emergent results were called by telephone at the time of interpretation on 01/08/2018 at 11:03 pm to Dr. Chaney Malling , who verbally acknowledged these results. Review of the MIP images confirms the above findings. IMPRESSION: 1. Acute saddle pulmonary embolism with right heart strain (RV to LV ratio 1.1) consistent with at least submassive (intermediate risk) PE. The presence of right heart strain has been associated with an increased risk of morbidity and mortality. Consider activation of code PE by paging [phone]. 2. No pulmonary edema or infarct. 3. Normal CTA of the aorta. 4. Left nephrolithiasis. Electronically Signed   By: Marnee Spring M.D.   On: 01/08/2018 23:16    Cardiac Studies   Echo pending  Patient Profile     52 y.o. male with no significant past medical history, who presented with SOB, chest pain, and lightheadedness, and was  found to have a saddle PE.   Assessment & Plan    1. Saddle PE: patient presented with chest pain and SOB, associated with diaphoresis and near syncope. Initial EKG was concerning for possible STEMI, however given complaints of radiation of pain to back, he underwent CTA C/A/P which revealed an acute saddle PE with right heart strain. Crit care evaluated and did not recommend TPA. Echo is pending. He was started on a heparin gtt. He appears euvolemic on exam - F/u Echo to assess RV strain - Continue management per primary team - Would likely benefit from a hematology consultation outpatient given unprovoked PE, as well as PCP appointment for routine cancer screening.  2. Elevated troponin: Troponin elevated to 2.02. Likely demand ischemia in the setting of acute saddle PE.  - Could consider outpatient ischemic testing (exercise stress myoview) after resolution of present illness if CP reoccurs.     For questions or updates, please contact [facility] Please consult [email or web address] for contact info under Cardiology/STEMI.  Signed, Beatriz Stallion, PA-C  01/09/2018, 10:08 AM   [phone]

## 2018-01-09 NOTE — Progress Notes (Signed)
Subjective: Patient admitted this morning, see detailed H&P by Dr Julian ReilGardner 52 year old male with no significant medical history came to hospital with crushing chest pain and lightheadedness.  Found to have saddle pulmonary embolism.  Started on IV heparin.  PCCM was consulted and no thrombolyses indicated at this time.  Vitals:   01/09/18 0505 01/09/18 0814  BP: 124/87 119/74  Pulse: 93 82  Resp: 18 20  Temp: 98.8 F (37.1 C) 98.5 F (36.9 C)  SpO2: 95% 95%      A/P Acute saddle pulmonary embolism Patient started on heparin, will switch to Eliquis or Xarelto and next 24 hours. Hypercoagulable work-up as outpatient Echocardiogram done, will follow the results    Meredeth IdeGagan S Sueo Cullen [facility] Pager- [phone]

## 2018-01-09 NOTE — Progress Notes (Signed)
PCCM Interval Note  Reviewed the patient's TTE results available this afternoon. Shows suppressed RV function. This in combination with his positive troponin make him a potential EKOS candidate. Have to balance these findings with the fact that he is hemodynamically stable, is not requiring any o2.   I discussed with Dr Archer AsaMcCullough with IR. He will review the CT chest, the case, and consider the pros / cons of EKOS. If there may be benefit then will offer to the patient.    Levy Pupaobert Amazin Pincock, MD, PhD 01/09/2018, 5:23 PM [facility] [phone] or if no answer [phone]

## 2018-01-09 NOTE — Progress Notes (Addendum)
ANTICOAGULATION CONSULT NOTE  Pharmacy Consult for Heparin Indication: pulmonary embolus  No Known Allergies  Patient Measurements: Height: 6\' 1"  (185.4 cm) Weight: 233 lb 12.8 oz (106.1 kg) IBW/kg (Calculated) : 79.9 Heparin Dosing Weight: 95 kg  Vital Signs: Temp: 98.5 F (36.9 C) (07/12 0814) Temp Source: Oral (07/12 0814) BP: 119/74 (07/12 0814) Pulse Rate: 82 (07/12 0814)  Labs: Recent Labs    01/08/18 2220 01/08/18 2229 01/09/18 0201 01/09/18 0605  HGB 15.7 17.0  --  15.2  HCT 49.9 50.0  --  46.3  PLT 78*  --   --  84*  HEPARINUNFRC  --   --   --  0.36  CREATININE  --  1.30* 1.38*  --   TROPONINI  --   --  1.25* 2.02*    Estimated Creatinine Clearance: 80.1 mL/min (A) (by C-G formula based on SCr of 1.38 mg/dL (H)).   Medical History: History reviewed. No pertinent past medical history.  Assessment: [age] YOM who [facility] with CP originally with concern for STEMI; however, now with concern for PE and to start heparin for anticoagulation. CT imaging showing acute saddle PE with R heart strain (RV:LV ratio 1.1).    Initial heparin level came back therapeutic at 0.36, on 1500 units/hr. Hgb stable at 15.2, plts low at 84. Troponin elevated from 0.1 to 2.02 this morning. No s/sx of bleeding. No infusion issues.   Goal of Therapy:  Heparin level 0.3-0.7 units/ml Monitor platelets by anticoagulation protocol: Yes   Plan:  -Increase heparin infusion to 1550 units/hr to keep in goal range -Obtain a 6 hour confirmatory heparin level -Monitor CBC, heparin level, and for s/sx of bleeding  -F/u plans for oral anticoagulation  Thank you for allowing pharmacy to be a part of this patient's care.  Girard CooterKimberly Perkins, PharmD Clinical Pharmacist  Pager: [phone] Phone: [phone] 01/09/2018 8:21 AM    ADDENDUM Confirmatory heparin level came back therapeutic at 0.36, on 1550 units/hr. CBC stable. No s/sx of bleeding. No infusion issues. Continue current  rate and obtain heparin level with morning labs.  Girard CooterKimberly Perkins, PharmD Clinical Pharmacist

## 2018-01-10 DIAGNOSIS — O2230 Deep phlebothrombosis in pregnancy, unspecified trimester: Secondary | ICD-10-CM

## 2018-01-10 DIAGNOSIS — R0902 Hypoxemia: Secondary | ICD-10-CM

## 2018-01-10 DIAGNOSIS — I824Y1 Acute embolism and thrombosis of unspecified deep veins of right proximal lower extremity: Secondary | ICD-10-CM

## 2018-01-10 DIAGNOSIS — I272 Pulmonary hypertension, unspecified: Secondary | ICD-10-CM

## 2018-01-10 DIAGNOSIS — I824Y9 Acute embolism and thrombosis of unspecified deep veins of unspecified proximal lower extremity: Secondary | ICD-10-CM

## 2018-01-10 LAB — HEPARIN LEVEL (UNFRACTIONATED): HEPARIN UNFRACTIONATED: 0.34 [IU]/mL (ref 0.30–0.70)

## 2018-01-10 LAB — CBC
HCT: 47.2 % (ref 39.0–52.0)
HEMOGLOBIN: 15.5 g/dL (ref 13.0–17.0)
MCH: 27.4 pg (ref 26.0–34.0)
MCHC: 32.8 g/dL (ref 30.0–36.0)
MCV: 83.5 fL (ref 78.0–100.0)
PLATELETS: 105 10*3/uL — AB (ref 150–400)
RBC: 5.65 MIL/uL (ref 4.22–5.81)
RDW: 16.3 % — AB (ref 11.5–15.5)
WBC: 7.5 10*3/uL (ref 4.0–10.5)

## 2018-01-10 MED ORDER — RIVAROXABAN 15 MG PO TABS
15.0000 mg | ORAL_TABLET | Freq: Once | ORAL | Status: AC
  Administered 2018-01-10: 15 mg via ORAL
  Filled 2018-01-10: qty 1

## 2018-01-10 MED ORDER — RIVAROXABAN (XARELTO) VTE STARTER PACK (15 & 20 MG): ORAL_TABLET | ORAL | 0 refills | Status: DC

## 2018-01-10 MED ORDER — AMLODIPINE BESYLATE 5 MG PO TABS
5.0000 mg | ORAL_TABLET | Freq: Every day | ORAL | Status: DC
  Administered 2018-01-10: 5 mg via ORAL
  Filled 2018-01-10: qty 1

## 2018-01-10 MED ORDER — RIVAROXABAN 15 MG PO TABS: 15.0000 mg | ORAL_TABLET | Freq: Two times a day (BID) | ORAL | Status: DC

## 2018-01-10 MED ORDER — AMLODIPINE BESYLATE 5 MG PO TABS: 5.0000 mg | ORAL_TABLET | Freq: Every day | ORAL | 2 refills | Status: DC

## 2018-01-10 NOTE — Progress Notes (Addendum)
Name: Christopher Coffey MRN: [id number] DOB: Dec 03, 1965    ADMISSION DATE:  01/08/2018 CONSULTATION DATE: January 08, 2018  REFERRING MD : Hospitalist service  CHIEF COMPLAINT: Lightheadedness dizziness chest pain not feeling well  BRIEF PATIENT DESCRIPTION: 52 year old with saddle submassive PE chest pain hemodynamically stable about to start on heparin unprovoked PE no family history no clear poor precipitating factors  SIGNIFICANT EVENTS  Submassive PE saddle PE with S1Q3T3 no tachycardia no hypoxia no hypotension  HISTORY OF PRESENT ILLNESS: This is a 52 year old African-American male with no past medical history presented with left-sided chest pain the patient is saying that he has baseline health status and he has a desk job for the past 3 years nothing changed in his routine no recent sedentary lifestyle limb injury or new medications weight loss weight gain fever chills rigors no nausea vomiting diarrhea and did not think much of it but today he felt like that chest pain not feeling well came into the ED and EMS truck he was briefly hypotensive and he was given 250 cc bolus saline but he got here his blood pressure was stable he does not take any medications he denies alcohol drugs or smoking.  Denies with complete gain or weight loss no family history of blood clots.  SUBJECTIVE:  No events overnight, feels better this AM, no new complaints  VITAL SIGNS: Temp:  [98.2 F (36.8 C)-99 F (37.2 C)] 98.2 F (36.8 C) (07/13 0806) Pulse Rate:  [70-90] 76 (07/13 0806) Resp:  [20-27] 27 (07/13 0500) BP: (132-140)/(77-96) 140/92 (07/13 0806) SpO2:  [92 %-94 %] 94 % (07/13 0806) Weight:  [232 lb 11.2 oz (105.6 kg)] 232 lb 11.2 oz (105.6 kg) (07/13 0500)  PHYSICAL EXAMINATION: General: Well appearing, on RA, NAD Neuro:  Alert and interactive, moving all ext to command HEENT:  [place]/AT, PERRL, EOM-I and MMM Cardiovascular:  RRR, Nl S1/S2 and -M/R/G Lungs:  CTA bilaterally Abdomen:  Soft, NT,  ND and +BS Musculoskeletal:  -edema and -tenderness Skin:  No rash   Recent Labs  Lab 01/08/18 2229 01/09/18 0201  NA 138 140  K 5.0 4.3  CL 106 104  CO2  --  23  BUN 18 12  CREATININE 1.30* 1.38*  GLUCOSE 177* 155*   Recent Labs  Lab 01/08/18 2220 01/08/18 2229 01/09/18 0605 01/10/18 0425  HGB 15.7 17.0 15.2 15.5  HCT 49.9 50.0 46.3 47.2  WBC 5.8  --  6.9 7.5  PLT 78*  --  84* 105*   Dg Chest Port 1 View  Result Date: 01/08/2018 CLINICAL DATA:  Acute onset crushing chest pain beginning 45 minutes ago. EXAM: PORTABLE CHEST 1 VIEW COMPARISON:  None. FINDINGS: Cardiomediastinal silhouette is normal. Pulmonary vascular congestion. No pleural effusions or focal consolidations. Trachea projects midline and there is no pneumothorax. Soft tissue planes and included osseous structures are non-suspicious. IMPRESSION: Pulmonary vascular congestion. Electronically Signed   By: Awilda Metroourtnay  Bloomer M.D.   On: 01/08/2018 22:31   Ct Angio Chest/abd/pel For Dissection W And/or Wo Contrast  Result Date: 01/08/2018 CLINICAL DATA:  Sudden onset crushing chest pain EXAM: CT ANGIOGRAPHY CHEST, ABDOMEN AND PELVIS TECHNIQUE: Multidetector CT imaging through the chest, abdomen and pelvis was performed using the standard protocol during bolus administration of intravenous contrast. Multiplanar reconstructed images and MIPs were obtained and reviewed to evaluate the vascular anatomy. CONTRAST:  100mL ISOVUE-370 IOPAMIDOL (ISOVUE-370) INJECTION 76% COMPARISON:  None. FINDINGS: CTA CHEST FINDINGS Cardiovascular: Acute saddle pulmonary embolism with extensive lobar and  segmental vascular narrowing. RV to LV ratio 1.1. No cardiomegaly or pericardial effusion. Normal appearance of the aorta. Mediastinum/Nodes: Negative for adenopathy Lungs/Pleura: Negative for edema or infarct Musculoskeletal: Negative Review of the MIP images confirms the above findings. CTA ABDOMEN AND PELVIS FINDINGS VASCULAR Aorta: Smooth normal  appearance Celiac: Standard branching with normal appearing vessels. SMA: Smooth normal appearance Renals: Single bilateral renal arteries with earlier branching on the left. Mild undulation of the left inferior branch without convincing beading for fibromuscular dysplasia. IMA: Patent. Inflow: Normal Veins: Negative in the arterial phase Review of the MIP images confirms the above findings. NON-VASCULAR Hepatobiliary: No focal liver abnormality.No evidence of biliary obstruction or stone. Pancreas: Unremarkable. Spleen: Unremarkable. Adrenals/Urinary Tract: Negative adrenals. Two left lower pole calculi measuring up to 4 mm. No hydronephrosis. Unremarkable bladder. Stomach/Bowel:  No obstruction. No inflammatory changes Lymphatic: No mass or adenopathy. Reproductive:No pathologic findings. Other: No ascites or pneumoperitoneum. Musculoskeletal: No acute abnormalities. Critical Value/emergent results were called by telephone at the time of interpretation on 01/08/2018 at 11:03 pm to Dr. Chaney Malling , who verbally acknowledged these results. Review of the MIP images confirms the above findings. IMPRESSION: 1. Acute saddle pulmonary embolism with right heart strain (RV to LV ratio 1.1) consistent with at least submassive (intermediate risk) PE. The presence of right heart strain has been associated with an increased risk of morbidity and mortality. Consider activation of code PE by paging [phone]. 2. No pulmonary edema or infarct. 3. Normal CTA of the aorta. 4. Left nephrolithiasis. Electronically Signed   By: Marnee Spring M.D.   On: 01/08/2018 23:16   ASSESSMENT / PLAN:  52 year old male with a PE that is massive with troponin leak who clinically appears well.  I discussed the case with IR.  I also had an extensive discussion with the patient.  Offered options of EKOS vs heparin and oral anti-coagulation.  I also had an extensive discussion with the patient and wife regarding risks and benefits to include  pulmonary HTN and right heart failure further down the line vs risk of bleeding as well as coumadin vs NOAC.  After an hour long discussion, the patient had decided to go with oral anti-coagulation and not under go EKOS.  Currently on heparin, is to decide on NOAC vs coumadin.  Discussed with IR.  PE:  - No EKOS  - Coumadin vs NOAC based on patient choice  - Heparin for now.  DVT:  - Coumadin vs NOAC  - Heparin for now  Hypoxemia:  - Ambulate and check O2 prior to leaving the hospital, patient is traveling via air at the end of the month, need to insure that patient will not need O2 with ambulation.  PCCM will sign off, please call back if needed.   Total time spent working on this case 90 minutes with 60 minutes face to face with the patient.  Alyson Reedy, M.D. [facility]Critical Care Medicine. Pager: [phone]. After hours pager: [phone].   01/10/2018, 12:22 PM

## 2018-01-10 NOTE — Discharge Instructions (Signed)
Information on my medicine - XARELTO (rivaroxaban)  This medication education was reviewed with me or my healthcare representative as part of my discharge preparation.  The pharmacist spoke with me during my hospital stay.  WHY WAS XARELTO PRESCRIBED FOR YOU? Xarelto was prescribed to treat blood clots that may have been found in the veins of your legs (deep vein thrombosis) or in your lungs (pulmonary embolism) and to reduce the risk of them occurring again.  What do you need to know about Xarelto? The starting dose is one 15 mg tablet taken TWICE daily with food for the FIRST 21 DAYS then on  01/31/2018 the dose is changed to one 20 mg tablet taken ONCE A DAY with your evening meal.  DO NOT stop taking Xarelto without talking to the health care provider who prescribed the medication.  Refill your prescription for 20 mg tablets before you run out.  After discharge, you should have regular check-up appointments with your healthcare provider that is prescribing your Xarelto.  In the future your dose may need to be changed if your kidney function changes by a significant amount.  What do you do if you miss a dose? If you are taking Xarelto TWICE DAILY and you miss a dose, take it as soon as you remember. You may take two 15 mg tablets (total 30 mg) at the same time then resume your regularly scheduled 15 mg twice daily the next day.  If you are taking Xarelto ONCE DAILY and you miss a dose, take it as soon as you remember on the same day then continue your regularly scheduled once daily regimen the next day. Do not take two doses of Xarelto at the same time.   Important Safety Information Xarelto is a blood thinner medicine that can cause bleeding. You should call your healthcare provider right away if you experience any of the following: ? Bleeding from an injury or your nose that does not stop. ? Unusual colored urine (red or dark brown) or unusual colored stools (red or  black). ? Unusual bruising for unknown reasons. ? A serious fall or if you hit your head (even if there is no bleeding).  Some medicines may interact with Xarelto and might increase your risk of bleeding while on Xarelto. To help avoid this, consult your healthcare provider or pharmacist prior to using any new prescription or non-prescription medications, including herbals, vitamins, non-steroidal anti-inflammatory drugs (NSAIDs) and supplements.  This website has more information on Xarelto: [email or web address].xarelto.[email or web address].

## 2018-01-10 NOTE — Progress Notes (Signed)
Chief Complaint: Patient was seen in consultation today for PE with heart strain at the request of *Dr. Jasmine Aweob Byrum  Referring Physician(s): Dr. Jasmine Aweob Byrum  Supervising Physician: Malachy MoanMcCullough, Heath  Patient Status: [facility] - In-pt  History of Present Illness: Christopher Coffey is a 52 y.o. male admitted with abrupt onset of CP and lightheadedness 2 days. His workup has found massive saddle and bilateral PE with significant right heart strain noted on both CT and Echo. He also has Troponin leak. PCCM has evaluated and consulted IR for consideration of catheter directed lytic therapy. The pt was placed on IV heparin drip and actually feels quite well. Currently denies CP, SOB. No supplemental O2 needs. Had LE duplex, I cannot find report yet. PMHx, meds, labs, imaging, allergies reviewed. Feels well otherwise. Just finished eating breakfast. Family at bedside. PMHx specifically related to contraindications or risk of thrombolytic therapy reviewed and are NONE.  History reviewed. No pertinent past medical history.  History reviewed. No pertinent surgical history.  Allergies: Patient has no known allergies.  Medications:  Current Facility-Administered Medications:  .  acetaminophen (TYLENOL) tablet 650 mg, 650 mg, Oral, Q6H PRN **OR** acetaminophen (TYLENOL) suppository 650 mg, 650 mg, Rectal, Q6H PRN, Hillary BowGardner, Jared M, DO .  heparin ADULT infusion 100 units/mL (25000 units/23450mL sodium chloride 0.45%), 1,550 Units/hr, Intravenous, Continuous, Sharl MaLama, Sarina IllGagan S, MD, Last Rate: 15.5 mL/hr at 01/10/18 0700, 1,550 Units/hr at 01/10/18 0700 .  morphine 4 MG/ML injection 2-4 mg, 2-4 mg, Intravenous, Q4H PRN, Julian ReilGardner, Jared M, DO .  nitroGLYCERIN (NITROSTAT) SL tablet 0.4 mg, 0.4 mg, Sublingual, Q5 min PRN, Means, Greig CastillaGregory Thomas, MD .  ondansetron [facility](ZOFRAN) tablet 4 mg, 4 mg, Oral, Q6H PRN **OR** ondansetron (ZOFRAN) injection 4 mg, 4 mg, Intravenous, Q6H PRN, Hillary BowGardner, Jared M, DO    No family  history on file.  Social History   Socioeconomic History  . Marital status: Married    Spouse name: Not on file  . Number of children: Not on file  . Years of education: Not on file  . Highest education level: Not on file  Occupational History  . Not on file  Social Needs  . Financial resource strain: Not on file  . Food insecurity:    Worry: Not on file    Inability: Not on file  . Transportation needs:    Medical: Not on file    Non-medical: Not on file  Tobacco Use  . Smoking status: Never Smoker  . Smokeless tobacco: Never Used  Substance and Sexual Activity  . Alcohol use: Yes  . Drug use: Never  . Sexual activity: Not on file  Lifestyle  . Physical activity:    Days per week: Not on file    Minutes per session: Not on file  . Stress: Not on file  Relationships  . Social connections:    Talks on phone: Not on file    Gets together: Not on file    Attends religious service: Not on file    Active member of club or organization: Not on file    Attends meetings of clubs or organizations: Not on file    Relationship status: Not on file  Other Topics Concern  . Not on file  Social History Narrative  . Not on file     Review of Systems: A 12 point ROS discussed and pertinent positives are indicated in the HPI above.  All other systems are negative.  Review of Systems  Vital Signs: BP Marland Kitchen(!)  140/92 (BP Location: Right Arm)   Pulse 76   Temp 98.2 F (36.8 C) (Oral)   Resp (!) 27   Ht 6\' 1"  (1.854 m)   Wt 232 lb 11.2 oz (105.6 kg)   SpO2 94%   BMI 30.70 kg/m   Physical Exam  Constitutional: He is oriented to person, place, and time. He appears well-developed and well-nourished. No distress.  HENT:  Head: Normocephalic.  Mouth/Throat: Oropharynx is clear and moist.  Neck: Normal range of motion. No JVD present.  Cardiovascular: Normal rate, regular rhythm, normal heart sounds and intact distal pulses.  Pulmonary/Chest: Effort normal and breath sounds  normal. No respiratory distress. He exhibits no tenderness.  Abdominal: Soft. There is no tenderness.  Musculoskeletal: He exhibits no edema or tenderness.  Neurological: He is alert and oriented to person, place, and time.  Skin: Skin is warm and dry.  Psychiatric: He has a normal mood and affect.    Imaging: Dg Chest Port 1 View  Result Date: 01/08/2018 CLINICAL DATA:  Acute onset crushing chest pain beginning 45 minutes ago. EXAM: PORTABLE CHEST 1 VIEW COMPARISON:  None. FINDINGS: Cardiomediastinal silhouette is normal. Pulmonary vascular congestion. No pleural effusions or focal consolidations. Trachea projects midline and there is no pneumothorax. Soft tissue planes and included osseous structures are non-suspicious. IMPRESSION: Pulmonary vascular congestion. Electronically Signed   By: Awilda Metro M.D.   On: 01/08/2018 22:31   Ct Angio Chest/abd/pel For Dissection W And/or Wo Contrast  Result Date: 01/08/2018 CLINICAL DATA:  Sudden onset crushing chest pain EXAM: CT ANGIOGRAPHY CHEST, ABDOMEN AND PELVIS TECHNIQUE: Multidetector CT imaging through the chest, abdomen and pelvis was performed using the standard protocol during bolus administration of intravenous contrast. Multiplanar reconstructed images and MIPs were obtained and reviewed to evaluate the vascular anatomy. CONTRAST:  ISOVUE-370 IOPAMIDOL (ISOVUE-370) INJECTION 76% COMPARISON:  None. FINDINGS: CTA CHEST FINDINGS Cardiovascular: Acute saddle pulmonary embolism with extensive lobar and segmental vascular narrowing. RV to LV ratio 1.1. No cardiomegaly or pericardial effusion. Normal appearance of the aorta. Mediastinum/Nodes: Negative for adenopathy Lungs/Pleura: Negative for edema or infarct Musculoskeletal: Negative Review of the MIP images confirms the above findings. CTA ABDOMEN AND PELVIS FINDINGS VASCULAR Aorta: Smooth normal appearance Celiac: Standard branching with normal appearing vessels. SMA: Smooth normal  appearance Renals: Single bilateral renal arteries with earlier branching on the left. Mild undulation of the left inferior branch without convincing beading for fibromuscular dysplasia. IMA: Patent. Inflow: Normal Veins: Negative in the arterial phase Review of the MIP images confirms the above findings. NON-VASCULAR Hepatobiliary: No focal liver abnormality.No evidence of biliary obstruction or stone. Pancreas: Unremarkable. Spleen: Unremarkable. Adrenals/Urinary Tract: Negative adrenals. Two left lower pole calculi measuring up to 4 mm. No hydronephrosis. Unremarkable bladder. Stomach/Bowel:  No obstruction. No inflammatory changes Lymphatic: No mass or adenopathy. Reproductive:No pathologic findings. Other: No ascites or pneumoperitoneum. Musculoskeletal: No acute abnormalities. Critical Value/emergent results were called by telephone at the time of interpretation on 01/08/2018 at 11:03 pm to Dr. Chaney Malling , who verbally acknowledged these results. Review of the MIP images confirms the above findings. IMPRESSION: 1. Acute saddle pulmonary embolism with right heart strain (RV to LV ratio 1.1) consistent with at least submassive (intermediate risk) PE. The presence of right heart strain has been associated with an increased risk of morbidity and mortality. Consider activation of code PE by paging [phone]. 2. No pulmonary edema or infarct. 3. Normal CTA of the aorta. 4. Left nephrolithiasis. Electronically Signed   By: Marja Kays  Watts M.D.   On: 01/08/2018 23:16    Labs:  CBC: Recent Labs    01/08/18 2220 01/08/18 2229 01/09/18 0605 01/10/18 0425  WBC 5.8  --  6.9 7.5  HGB 15.7 17.0 15.2 15.5  HCT 49.9 50.0 46.3 47.2  PLT 78*  --  84* 105*    COAGS: No results for input(s): INR, APTT in the last 8760 hours.  BMP: Recent Labs    01/08/18 2229 01/09/18 0201  NA 138 140  K 5.0 4.3  CL 106 104  CO2  --  23  GLUCOSE 177* 155*  BUN 18 12  CALCIUM  --  8.9  CREATININE 1.30* 1.38*    GFRNONAA  --  57*  GFRAA  --  >60    LIVER FUNCTION TESTS: Recent Labs    01/09/18 0201  BILITOT 0.7  AST 157*  ALT 124*  ALKPHOS 72  PROT 7.1  ALBUMIN 3.7    TUMOR MARKERS: No results for input(s): AFPTM, CEA, CA199, CHROMGRNA in the last 8760 hours.  Assessment and Plan: Submassive saddle and bilateral PE with right heart strain. Significant RVEF depression on Echo also c/w heart strain. +Troponin leak. Currently stable on IV heparin. Given pt age and evidence of heart strain on studies, feel pt would benefit from catheter directed lysis. His risk factors are none/minimal and his benefit of reduced risk of future heart failure is high. He would not benefit symptomatically given that he is currently asymptomatic. The procedure of EKOS/catheter directed TPA/lytic therapy was discussed, including the risks of bleeding/hemorrhage, infection, renal injury from contrast, intravascular injury. He would need to spend at least 1 night in ICU for close observation.  PCCM to see again today and further discuss.   Thank you for this interesting consult.  I greatly enjoyed meeting Willey Due and look forward to participating in their care.  A copy of this report was sent to the requesting provider on this date.  Electronically Signed: Brayton El, PA-C 01/10/2018, 10:11 AM   I spent a total of 40 minutes in face to face in clinical consultation, greater than 50% of which was counseling/coordinating care for PE lysis

## 2018-01-10 NOTE — Progress Notes (Signed)
ANTICOAGULATION CONSULT NOTE  Pharmacy Consult for Heparin Indication: pulmonary embolus  No Known Allergies  Patient Measurements: Height: 6\' 1"  (185.4 cm) Weight: 232 lb 11.2 oz (105.6 kg) IBW/kg (Calculated) : 79.9 Heparin Dosing Weight: 95 kg  Vital Signs: Temp: 98.2 F (36.8 C) (07/13 0806) Temp Source: Oral (07/13 0806) BP: 140/92 (07/13 0806) Pulse Rate: 76 (07/13 0806)  Labs: Recent Labs    01/08/18 2220 01/08/18 2229 01/09/18 0201 01/09/18 0605 01/09/18 1335 01/10/18 0425  HGB 15.7 17.0  --  15.2  --  15.5  HCT 49.9 50.0  --  46.3  --  47.2  PLT 78*  --   --  84*  --  105*  HEPARINUNFRC  --   --   --  0.36 0.36 0.34  CREATININE  --  1.30* 1.38*  --   --   --   TROPONINI  --   --  1.25* 2.02* 2.03*  --     Estimated Creatinine Clearance: 79.9 mL/min (A) (by C-G formula based on SCr of 1.38 mg/dL (H)).   Medical History: History reviewed. No pertinent past medical history.  Assessment: [age] Y[facility]. CT imaging showing acute saddle PE with R heart strain (RV:LV ratio 1.1), RLE positive for DVT.   Heparin level therapeutic at 0.34, on 1550 units/hr. Hgb stable, plts trending up to 105. No s/sx of bleeding. No infusion issues.   Goal of Therapy:  Heparin level 0.3-0.7 units/ml Monitor platelets by anticoagulation protocol: Yes   Plan:  -Continue heparin infusion at 1550 units/hr  -Monitor CBC, heparin level, and for s/sx of bleeding  -F/u plans for EKOS and oral anticoagulation plans  Thank you for allowing pharmacy to be a part of this patients care.  Lenward Chancelloranya Makhlouf, PharmD PGY1 Pharmacy Resident Phone [phone] 01/10/2018 11:14 AM

## 2018-01-10 NOTE — Progress Notes (Signed)
Recent cardiology follow-up notes reviewed.  Patient was seen for abnormal troponin I levels in the setting of saddle pulmonary embolus.  Echocardiogram demonstrates LVEF 55 to 60% with severely decreased right ventricular contraction and McConnell's sign, PASP estimated 67 mmHg, consistent with RV strain.  Patient is being followed by the Pulmonary Critical Care team to assist with management.  It is most likely that the abnormal troponin I levels are related to myocardial strain in the setting of saddle pulmonary embolus rather than representing ACS.  No further cardiac ischemic testing is planned at this time.  [facility] will sign off.   Medication Recommendations: Defer to primary team and Pulmonary Critical Care.  No new cardiac medications are being added. Other recommendations (labs, testing, etc):  Hypercoagulability work-up per primary team. Follow up as an outpatient: Specific cardiac follow-up will not be arranged at this time unless there are concerns that additional outpatient ischemic testing becomes necessary.  Jonelle SidleSamuel G. Lexi Conaty, M.D., F.A.C.C.

## 2018-01-10 NOTE — Progress Notes (Signed)
SATURATION QUALIFICATIONS: (This note is used to comply with regulatory documentation for home oxygen)  Patient Saturations on Room Air at Rest = 95  Patient Saturations on Room Air while Ambulating = 95-100  Patient Saturations on 0 Liters of oxygen while Ambulating = 95-100  Please briefly explain why patient needs home oxygen: Pt does not need home oxygen

## 2018-01-10 NOTE — Discharge Summary (Addendum)
Physician Discharge Summary  Debera LatMaurice Ewton ZOX:[id number] DOB: February 21, 1966 DOA: 01/08/2018  PCP: No primary care provider on file.  Admit date: 01/08/2018 Discharge date: 01/10/2018  Time spent: *45 minutes  Recommendations for Outpatient Follow-up:  1. Follow up PCP in 2 weeks 2. 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  Found to have saddle pulmonary embolism.  Started on IV heparin.  PCCM was consulted    Hospital Course:   Acute pulmonary embolism- CT chest showed acute saddle pulmonary embolism with right heart strain, patient was started on IV heparin.  Echocardiogram showed EF 55 to 60%, grade 1 diastolic dysfunction, RV EF severely depressed with small region of preserved function at apex cavity was mildly dilated. EKOS was offered to patient by PCCM, but at this time he refused to undergo the procedure and his opted for oral anticoagulation.  Will discharge patient on Xarelto. He needs to be on anticoagulation at least for 6 months, also will follow up with the PC P as outpatient for hypercoagulable work-up preferably by hematologist as outpatient  Patient was ambulated in the hospital before discharge, O2 sats remained 95% to 100% on room air on ambulation he is not requiring oxygen.  Right lower extremity DVT-vascular ultrasound was done which showed right lower extremity  DVT involving right popliteal and peroneal veins.  Patient is currently on Xarelto.  Hypertension-blood pressure is elevated, will start amlodipine 5 mg p.o. daily  Procedures:  Echocardiogram  Consultations:  PCCM  Discharge Exam: Vitals:   01/10/18 0806 01/10/18 1258  BP: (!) 140/92 (!) 147/95  Pulse: 76 73  Resp:    Temp: 98.2 F (36.8 C) 98.2 F (36.8 C)  SpO2: 94% 98%    General: Appears in no acute distress Cardiovascular: S1-S2 regular Respiratory: Clear bilaterally hypertension  Discharge Instructions   Discharge Instructions    Diet - low sodium heart healthy   Complete by:  As directed    Increase activity slowly   Complete by:  As directed      Allergies as of 01/10/2018   No Known Allergies     Medication List    TAKE these medications   amLODipine 5 MG tablet Commonly known as:  NORVASC Take 1 tablet (5 mg total) by mouth daily.   Rivaroxaban 15 & 20 MG Tbpk Take as directed on package: Start with one 15mg  tablet by mouth twice a day with food. On Day 22, switch to one 20mg  tablet once a day with food.      No Known Allergies Follow-up Information    [facility]. Go on 01/22/2018.   Why:  1pm with Dr.Cory Nafziger Contact information: [address]  [place], [place]  [phone]           The results of significant diagnostics from this hospitalization (including imaging, microbiology, ancillary and laboratory) are listed below for reference.  Significant Diagnostic Studies: Dg Chest Port 1 View  Result Date: 01/08/2018 CLINICAL DATA:  Acute onset crushing chest pain beginning 45 minutes ago. EXAM: PORTABLE CHEST 1 VIEW COMPARISON:  None. FINDINGS: Cardiomediastinal silhouette is normal. Pulmonary vascular congestion. No pleural effusions or focal consolidations. Trachea projects midline and there is no pneumothorax. Soft tissue planes and included osseous structures are non-suspicious. IMPRESSION:  Pulmonary vascular congestion. Electronically Signed   By: Awilda Metro M.D.   On: 01/08/2018 22:31   Ct Angio Chest/abd/pel For Dissection W And/or Wo Contrast  Result Date: 01/08/2018 CLINICAL DATA:  Sudden onset crushing chest pain EXAM: CT ANGIOGRAPHY CHEST, ABDOMEN AND PELVIS TECHNIQUE: Multidetector CT imaging through the chest, abdomen and pelvis was performed using the standard protocol during bolus administration of intravenous contrast. Multiplanar reconstructed images and MIPs were obtained and reviewed to evaluate the vascular anatomy. CONTRAST:  ISOVUE-370 IOPAMIDOL (ISOVUE-370) INJECTION 76% COMPARISON:  None. FINDINGS: CTA CHEST FINDINGS Cardiovascular: Acute saddle pulmonary embolism with extensive lobar and segmental vascular narrowing. RV to LV ratio 1.1. No cardiomegaly or pericardial effusion. Normal appearance of the aorta. Mediastinum/Nodes: Negative for adenopathy Lungs/Pleura: Negative for edema or infarct Musculoskeletal: Negative Review of the MIP images confirms the above findings. CTA ABDOMEN AND PELVIS FINDINGS VASCULAR Aorta: Smooth normal appearance Celiac: Standard branching with normal appearing vessels. SMA: Smooth normal appearance Renals: Single bilateral renal arteries with earlier branching on the left. Mild undulation of the left inferior branch without convincing beading for fibromuscular dysplasia. IMA: Patent. Inflow: Normal Veins: Negative in the arterial phase Review of the MIP images confirms the above findings. NON-VASCULAR Hepatobiliary: No focal liver abnormality.No evidence of biliary obstruction or stone. Pancreas: Unremarkable. Spleen: Unremarkable. Adrenals/Urinary Tract: Negative adrenals. Two left lower pole calculi measuring up to 4 mm. No hydronephrosis. Unremarkable bladder. Stomach/Bowel:  No obstruction. No inflammatory changes Lymphatic: No mass or adenopathy. Reproductive:No pathologic findings. Other: No ascites or pneumoperitoneum.  Musculoskeletal: No acute abnormalities. Critical Value/emergent results were called by telephone at the time of interpretation on 01/08/2018 at 11:03 pm to Dr. Chaney Malling , who verbally acknowledged these results. Review of the MIP images confirms the above findings. IMPRESSION: 1. Acute saddle pulmonary embolism with right heart strain (RV to LV ratio 1.1) consistent with at least submassive (intermediate risk) PE. The presence of right heart strain has been associated with an increased risk of morbidity and mortality. Consider activation of code PE by paging [phone]. 2. No pulmonary edema or infarct. 3. Normal CTA of the aorta. 4. Left nephrolithiasis. Electronically Signed   By: Marnee Spring M.D.   On: 01/08/2018 23:16    Microbiology: Recent Results (from the past 240 hour(s))  MRSA PCR Screening     Status: None   Collection Time: 01/09/18  2:44 AM  Result Value Ref Range Status   MRSA by PCR NEGATIVE NEGATIVE Final    Comment:        The GeneXpert MRSA Assay (FDA approved for NASAL specimens only), is one component of a comprehensive MRSA colonization surveillance program. It is not intended to diagnose MRSA infection nor to guide or monitor treatment for MRSA infections. Performed at [facility], [address] [place], [place] [address]      Labs: Basic Metabolic Panel: Recent Labs  Lab 01/08/18 2229 01/09/18 0201  NA 138 140  K 5.0 4.3  CL 106 104  CO2  --  23  GLUCOSE 177* 155*  BUN 18 12  CREATININE 1.30* 1.38*  CALCIUM  --  8.9   Liver Function Tests: Recent Labs  Lab 01/09/18 0201  AST 157*  ALT 124*  ALKPHOS 72  BILITOT 0.7  PROT 7.1  ALBUMIN 3.7   Recent Labs  Lab 01/09/18 0201  LIPASE 36   No results for input(s): AMMONIA in the last 168 hours. CBC: Recent Labs  Lab 01/08/18 2220 01/08/18 2229 01/09/18 0605 01/10/18 0425  WBC 5.8  --  6.9 7.5  NEUTROABS 2.7  --   --   --   HGB 15.7 17.0 15.2 15.5  HCT 49.9 50.0 46.3 47.2   MCV 87.1  --  83.6 83.5  PLT 78*  --  84* 105*   Cardiac Enzymes: Recent Labs  Lab 01/09/18 0201 01/09/18 0605 01/09/18 1335  TROPONINI 1.25* 2.02* 2.03*   BNP:     Signed:  Meredeth Ide MD.  [facility] 01/10/2018, 1:49 PM

## 2018-01-10 NOTE — Progress Notes (Signed)
ANTICOAGULATION CONSULT NOTE  Pharmacy Consult for Heparin>>changed to Xarelto Indication: pulmonary embolus  No Known Allergies  Patient Measurements: Height: 6\' 1"  (185.4 cm) Weight: 232 lb 11.2 oz (105.6 kg) IBW/kg (Calculated) : 79.9 Heparin Dosing Weight: 95 kg  Vital Signs: Temp: 98.2 F (36.8 C) (07/13 1258) Temp Source: Oral (07/13 1258) BP: 147/95 (07/13 1258) Pulse Rate: 73 (07/13 1258)  Labs: Recent Labs    01/08/18 2220 01/08/18 2229 01/09/18 0201 01/09/18 0605 01/09/18 1335 01/10/18 0425  HGB 15.7 17.0  --  15.2  --  15.5  HCT 49.9 50.0  --  46.3  --  47.2  PLT 78*  --   --  84*  --  105*  HEPARINUNFRC  --   --   --  0.36 0.36 0.34  CREATININE  --  1.30* 1.38*  --   --   --   TROPONINI  --   --  1.25* 2.02* 2.03*  --     Estimated Creatinine Clearance: 79.9 mL/min (A) (by C-G formula based on SCr of 1.38 mg/dL (H)).   Medical History: History reviewed. No pertinent past medical history.  Assessment: [age] Y[facility]. CT imaging showing acute saddle PE with R heart strain (RV:LV ratio 1.1), RLE positive for DVT.   Previously therapeutic on heparin - now changing to Xarelto. No s/sx of bleeding. Given new DVT/PE will be dosing as treatment dose. Renal function stable (Scr 1.38, CrCl>30)  Goal of Therapy:  Heparin level 0.3-0.7 units/ml Monitor platelets by anticoagulation protocol: Yes   Plan:  -D/c heparin -Start Xarelto 15mg  PO BID with meals x21 days followed by 20mg  PO daily with meals -Monitor CBC and for s/sx of bleeding   Thank you for allowing pharmacy to be a part of this patients care.  Lenward Chancelloranya Makhlouf, PharmD PGY1 Pharmacy Resident Phone [phone] 01/10/2018 2:11 PM

## 2018-01-10 NOTE — Plan of Care (Signed)
  Problem: Education: Goal: Knowledge of General Education information will improve Outcome: Completed/Met   Problem: Health Behavior/Discharge Planning: Goal: Ability to manage health-related needs will improve Outcome: Completed/Met   Problem: Clinical Measurements: Goal: Ability to maintain clinical measurements within normal limits will improve Outcome: Completed/Met Goal: Will remain free from infection Outcome: Completed/Met Goal: Diagnostic test results will improve Outcome: Completed/Met Goal: Respiratory complications will improve Outcome: Completed/Met Goal: Cardiovascular complication will be avoided Outcome: Completed/Met   Problem: Activity: Goal: Risk for activity intolerance will decrease Outcome: Completed/Met   Problem: Nutrition: Goal: Adequate nutrition will be maintained Outcome: Completed/Met   Problem: Elimination: Goal: Will not experience complications related to bowel motility Outcome: Completed/Met Goal: Will not experience complications related to urinary retention Outcome: Completed/Met   Problem: Pain Managment: Goal: General experience of comfort will improve Outcome: Completed/Met   Problem: Safety: Goal: Ability to remain free from injury will improve Outcome: Completed/Met

## 2018-01-22 ENCOUNTER — Encounter (HOSPITAL_COMMUNITY): Payer: Self-pay | Admitting: *Deleted

## 2018-01-22 ENCOUNTER — Ambulatory Visit: Payer: Self-pay | Admitting: *Deleted

## 2018-01-22 ENCOUNTER — Emergency Department (HOSPITAL_COMMUNITY)
Admission: EM | Admit: 2018-01-22 | Discharge: 2018-01-22 | Disposition: A | Discharge status: Home / Self Care | Payer: 59 | Attending: Emergency Medicine | Admitting: Emergency Medicine

## 2018-01-22 ENCOUNTER — Ambulatory Visit: Payer: 59 | Admitting: Adult Health

## 2018-01-22 DIAGNOSIS — I10 Essential (primary) hypertension: Secondary | ICD-10-CM | POA: Insufficient documentation

## 2018-01-22 DIAGNOSIS — L03116 Cellulitis of left lower limb: Secondary | ICD-10-CM | POA: Diagnosis not present

## 2018-01-22 DIAGNOSIS — Z7901 Long term (current) use of anticoagulants: Secondary | ICD-10-CM | POA: Insufficient documentation

## 2018-01-22 DIAGNOSIS — Z86718 Personal history of other venous thrombosis and embolism: Secondary | ICD-10-CM | POA: Diagnosis not present

## 2018-01-22 DIAGNOSIS — Z86711 Personal history of pulmonary embolism: Secondary | ICD-10-CM | POA: Diagnosis not present

## 2018-01-22 DIAGNOSIS — Z79899 Other long term (current) drug therapy: Secondary | ICD-10-CM | POA: Diagnosis not present

## 2018-01-22 DIAGNOSIS — R2242 Localized swelling, mass and lump, left lower limb: Secondary | ICD-10-CM | POA: Diagnosis present

## 2018-01-22 HISTORY — DX: Essential (primary) hypertension: I10

## 2018-01-22 HISTORY — DX: Acute embolism and thrombosis of unspecified deep veins of unspecified lower extremity: I82.409

## 2018-01-22 HISTORY — DX: Other pulmonary embolism without acute cor pulmonale: I26.99

## 2018-01-22 MED ORDER — DOXYCYCLINE HYCLATE 100 MG PO CAPS: 100.0000 mg | ORAL_CAPSULE | Freq: Two times a day (BID) | ORAL | 0 refills | Status: DC

## 2018-01-22 NOTE — Telephone Encounter (Signed)
Admitted to hospital for pulmonary embolism and clot in right leg. Patient is currently being treated with BP medication and blood thinner. Patient was admitted 7/11-7/13.Wife is calling to report pain, swelling and some discoloration in left foot. Patient is limping when walks. Patient has NP appointment in August with Dr Salomon FickBanks. Call to office to see if he can be seen as acute appointment today with her. No appointment available- advised ED today and follow up with Dr Salomon FickBanks at NP appointment.   Reason for Disposition . [1] Swollen foot AND [2] fever  Answer Assessment - Initial Assessment Questions 1. ONSET: "When did the pain start?"      Pain in left foot on Sunday- swelling began 2. LOCATION: "Where is the pain located?"      Left side of foot- started at little toe and travel across to the bridge of foot 3. PAIN: "How bad is the pain?"    (Scale 1-10; or mild, moderate, severe)   -  MILD (1-3): doesn't interfere with normal activities    -  MODERATE (4-7): interferes with normal activities (e.g., work or school) or awakens from sleep, limping    -  SEVERE (8-10): excruciating pain, unable to do any normal activities, unable to walk     Sitting- 3-4   Walking- 4-7 4. WORK OR EXERCISE: "Has there been any recent work or exercise that involved this part of the body?"      Walks on treadmill- otherwise no 5. CAUSE: "What do you think is causing the foot pain?"     No clue 6. OTHER SYMPTOMS: "Do you have any other symptoms?" (e.g., leg pain, rash, fever, numbness)     no 7. PREGNANCY: "Is there any chance you are pregnant?" "When was your last menstrual period?"     n/a  Protocols used: FOOT PAIN-A-AH

## 2018-01-22 NOTE — Telephone Encounter (Signed)
Patient [facility].

## 2018-01-22 NOTE — ED Notes (Signed)
ED Provider at bedside. 

## 2018-01-22 NOTE — Discharge Instructions (Signed)
Take antibiotic as prescribed for potential skin infection involving your left foot.  Return in 48 hrs if you notice no improvement or worsening of your condition.  Avoid prolonged sun exposure as it can cause a reaction while you are taking doxycycline.

## 2018-01-22 NOTE — ED Triage Notes (Signed)
Pt in c/o left foot pain and swelling for the last few days, pt recently admitted for a PE and DVT to his right calf, pt started on xarelto and is still taking this

## 2018-01-22 NOTE — ED Provider Notes (Signed)
[facility] EMERGENCY DEPARTMENT Provider Note   CSN: [id number] Arrival date & time: 01/22/18  4098     History   Chief Complaint Chief Complaint  Patient presents with  . Foot Swelling    HPI Christopher Coffey is a 52 y.o. male.  The history is provided by the patient. No language interpreter was used.     52 year old male with recent diagnosis of saddle emboli, and DVT involving the right leg currently on Xarelto presenting complaining of left foot pain and swelling.  Patient report 2 weeks ago he developed shortness of breath and was hospitalized with a diagnosis of blood clot in his lung and his right lower extremity.  He was started on Xarelto and after 2 days hospital stay, he was discharged home with blood pressure medication and Xarelto.  He has been compliant with his medication.  5 days ago he noticed some mild tenderness to his left foot near the little toe.  Symptoms seems to intensify, got better, but for the past 2 days he noticed increasing pain to the foot with swelling and redness to the dorsum of the foot which is new.  Pain is sharp, throbbing, mild at this time.  There is no associated numbness, ankle pain or calf pain.  No fever or chills.  No increased shortness of breath or chest pain.  He did recall walking on the treadmill barefoot a day prior but denies any injury.  He did take a Tylenol yesterday with some improvement.  No treatment today.  No history of diabetes.  He did report older brother with history of blood clots.  He is scheduled to follow-up with a primary care provider for a hypercoagulability work-up in August.  Past Medical History:  Diagnosis Date  . DVT (deep venous thrombosis) (HCC)   . Hypertension   . Pulmonary embolism [facility])     Patient Active Problem List   Diagnosis Date Noted  . DVT (deep vein thrombosis) in pregnancy (HCC)   . Hypoxemia   . Pulmonary HTN (HCC)   . Deep vein thrombosis (DVT) of proximal lower extremity  (HCC)   . Acute saddle pulmonary embolism (HCC) 01/09/2018  . Acute saddle pulmonary embolism with acute cor pulmonale (HCC)     History reviewed. No pertinent surgical history.      Home Medications    Prior to Admission medications   Medication Sig Start Date End Date Taking? Authorizing Provider  amLODipine (NORVASC) 5 MG tablet Take 1 tablet (5 mg total) by mouth daily. 01/10/18   Meredeth Ide, MD  Rivaroxaban 15 & 20 MG TBPK Take as directed on package: Start with one 15mg  tablet by mouth twice a day with food. On Day 22, switch to one 20mg  tablet once a day with food. 01/10/18   Meredeth Ide, MD    Family History History reviewed. No pertinent family history.  Social History Social History   Tobacco Use  . Smoking status: Never Smoker  . Smokeless tobacco: Never Used  Substance Use Topics  . Alcohol use: Yes  . Drug use: Never     Allergies   Patient has no known allergies.   Review of Systems Review of Systems  Skin: Positive for rash.  All other systems reviewed and are negative.    Physical Exam Updated Vital Signs BP (!) 164/87 (BP Location: Right Arm)   Pulse 81   Temp 98.5 F (36.9 C) (Oral)   Resp 20   SpO2 100%  Physical Exam  Constitutional: He appears well-developed and well-nourished. No distress.  HENT:  Head: Atraumatic.  Eyes: Conjunctivae are normal.  Neck: Neck supple.  Cardiovascular: Normal rate and regular rhythm.  Pulmonary/Chest: Effort normal and breath sounds normal.  Musculoskeletal: He exhibits tenderness (Left foot: An area of erythema, mildly edematous, and warmth approximately 3 x 4 cm in diameter noted to the lateral dorsum of left foot tenderness to palpation.).  Left foot with intact process pedis pulse with brisk cap refills and no deformity.  Left ankle and left calves are nontender and nonedematous.  Neurological: He is alert.  Skin: Rash (Please refer to musculoskeletal section.) noted.  Psychiatric: He has a  normal mood and affect.  Nursing note and vitals reviewed.    ED Treatments / Results  Labs (all labs ordered are listed, but only abnormal results are displayed) Labs Reviewed - No data to display  EKG None  Radiology No results found.  Procedures Procedures (including critical care time)  Medications Ordered in ED Medications - No data to display   Initial Impression / Assessment and Plan / ED Course  I have reviewed the triage vital signs and the nursing notes.  Pertinent labs & imaging results that were available during my care of the patient were reviewed by me and considered in my medical decision making (see chart for details).     BP (!) 164/87 (BP Location: Right Arm)   Pulse 81   Temp 98.5 F (36.9 C) (Oral)   Resp 20   SpO2 100%    Final Clinical Impressions(s) / ED Diagnoses   Final diagnoses:  Cellulitis of left foot    ED Discharge Orders        Ordered    doxycycline (VIBRAMYCIN) 100 MG capsule  2 times daily     01/22/18 1011     9:24 AM Patient recently diagnosed with PE and right DVT currently on Xarelto here with pain swelling and redness involving the dorsum of his left foot.  Findings suggestive of cellulitis.  Low suspicion for fractures or dislocation doubt abscess, doubt DVT or spontaneous bleed.  No worsening chest pain or shortness of breath.  I would like to initiate antibiotic and close follow-up for recheck.  Care discussed with Dr. Madilyn Hookees.   Fayrene Helperran, Zimir Kittleson, PA-C 01/22/18 1015    Tilden Fossaees, Elizabeth, MD 01/28/18 [phone]

## 2018-02-04 ENCOUNTER — Ambulatory Visit: Payer: 59 | Admitting: Family Medicine

## 2018-02-04 ENCOUNTER — Ambulatory Visit (INDEPENDENT_AMBULATORY_CARE_PROVIDER_SITE_OTHER): Payer: 59

## 2018-02-04 ENCOUNTER — Encounter: Payer: Self-pay | Admitting: Family Medicine

## 2018-02-04 VITALS — BP 128/90 | HR 74 | Temp 98.6°F | Ht 73.0 in | Wt 229.0 lb

## 2018-02-04 DIAGNOSIS — Z7689 Persons encountering health services in other specified circumstances: Secondary | ICD-10-CM | POA: Diagnosis not present

## 2018-02-04 DIAGNOSIS — R600 Localized edema: Secondary | ICD-10-CM | POA: Diagnosis not present

## 2018-02-04 DIAGNOSIS — M79672 Pain in left foot: Secondary | ICD-10-CM

## 2018-02-04 DIAGNOSIS — I2602 Saddle embolus of pulmonary artery with acute cor pulmonale: Secondary | ICD-10-CM

## 2018-02-04 DIAGNOSIS — I10 Essential (primary) hypertension: Secondary | ICD-10-CM | POA: Diagnosis not present

## 2018-02-04 LAB — URIC ACID: Uric Acid, Serum: 8.3 mg/dL — ABNORMAL HIGH (ref 4.0–7.8)

## 2018-02-04 LAB — CBC
HCT: 48.7 % (ref 39.0–52.0)
HEMOGLOBIN: 16.2 g/dL (ref 13.0–17.0)
MCHC: 33.3 g/dL (ref 30.0–36.0)
MCV: 84.7 fl (ref 78.0–100.0)
PLATELETS: 198 10*3/uL (ref 150.0–400.0)
RBC: 5.75 Mil/uL (ref 4.22–5.81)
RDW: 15.4 % (ref 11.5–15.5)
WBC: 4.3 10*3/uL (ref 4.0–10.5)

## 2018-02-04 MED ORDER — AMLODIPINE BESYLATE 5 MG PO TABS: 5.0000 mg | ORAL_TABLET | Freq: Every day | ORAL | 5 refills | Status: DC

## 2018-02-04 MED ORDER — RIVAROXABAN 20 MG PO TABS: 20.0000 mg | ORAL_TABLET | Freq: Every day | ORAL | 2 refills | Status: DC

## 2018-02-04 NOTE — Progress Notes (Signed)
Patient presents to clinic today to establish care.  SUBJECTIVE: PMH: Patient is a 52 year old male with past history significant for saddle PE, DVT, HTN.  Patient has not had a primary provider in several years.  He was last seen in [place].  Pt hospitalized 7/11-7/13/2019 for acute saddle pulmonary embolism and DVT.  Pt started on IV heparin and d/c'd on Xarelto.  Anticoagulation x 6 months recommended.  While hospitalized patient was started on Norvasc 5 mg daily for HTN.  Since discharge he has not been checking his blood pressure at home.  Pt states he eats some fast food and frozen meals. Pt does not currently exercise.  States he drinks plenty of water daily.  Pt returned to the ED on 7/25 for cellulitis L foot.  Given doxycycline x1 week.  Since completing abx patient endorses continued left foot pain with off-and-on swelling.  Allergies: NKDA  Past surgical hx: none Past Medical History:  Diagnosis Date  . DVT (deep venous thrombosis) ([place])   . Hypertension   . Pulmonary embolism ([place])     History reviewed. No pertinent surgical history.  Current Outpatient Medications on File Prior to Visit  Medication Sig Dispense Refill  . acetaminophen (TYLENOL) 500 MG tablet Take 500 mg by mouth every 6 (six) hours as needed for mild pain.     No current facility-administered medications on file prior to visit.     No Known Allergies  Family History  Problem Relation Age of Onset  . Hypertension Mother   . Deep vein thrombosis Brother     Social History   Socioeconomic History  . Marital status: Married    Spouse name: Not on file  . Number of children: Not on file  . Years of education: Not on file  . Highest education level: Not on file  Occupational History  . Not on file  Social Needs  . Financial resource strain: Not on file  . Food insecurity:    Worry: Not on file    Inability: Not on file  . Transportation needs:    Medical: Not on file   Non-medical: Not on file  Tobacco Use  . Smoking status: Never Smoker  . Smokeless tobacco: Never Used  Substance and Sexual Activity  . Alcohol use: Yes  . Drug use: Never  . Sexual activity: Yes  Lifestyle  . Physical activity:    Days per week: Not on file    Minutes per session: Not on file  . Stress: Not on file  Relationships  . Social connections:    Talks on phone: Not on file    Gets together: Not on file    Attends religious service: Not on file    Active member of club or organization: Not on file    Attends meetings of clubs or organizations: Not on file    Relationship status: Not on file  . Intimate partner violence:    Fear of current or ex partner: Not on file    Emotionally abused: Not on file    Physically abused: Not on file    Forced sexual activity: Not on file  Other Topics Concern  . Not on file  Social History Narrative  . Not on file    ROS General: Denies fever, chills, night sweats, changes in weight, changes in appetite HEENT: Denies headaches, ear pain, changes in vision, rhinorrhea, sore throat CV: Denies CP, palpitations, SOB, orthopnea  +RLE DVT and saddle pulmonary embolism. Pulm: Denies  SOB, cough, wheezing GI: Denies abdominal pain, nausea, vomiting, diarrhea, constipation GU: Denies dysuria, hematuria, frequency, vaginal discharge Msk: Denies muscle cramps, joint pains  + left foot pain and edema Neuro: Denies weakness, numbness, tingling Skin: Denies rashes, bruising Psych: Denies depression, anxiety, hallucinations  BP 128/90 (BP Location: Left Arm, Patient Position: Sitting, Cuff Size: Large)   Pulse 74   Temp 98.6 F (37 C) (Oral)   Ht '6\' 1"'  (1.854 m)   Wt 229 lb (103.9 kg)   SpO2 97%   BMI 30.21 kg/m   Physical Exam Gen. Pleasant, well developed, well-nourished, in NAD HEENT - [place]/AT, PERRL, no scleral icterus, no nasal drainage, pharynx without erythema or exudate. Lungs: no use of accessory muscles, CTAB, no wheezes,  rales or rhonchi Cardiovascular: RRR, No r/g/m, no peripheral edema Abdomen: BS present, soft, nontender, nondistended Musculoskeletal: L foot TTP of dorsolateral surface near 5th digit and proximal to 1st webspace.  No erythema or edema noted.  Strength 5/5 of bilateral feet.  No deformities, moves all four extremities, no cyanosis or clubbing, normal tone Neuro:  A&Ox3, CN II-XII intact, normal gait Skin:  Warm, dry, intact, no lesions  Recent Results (from the past 2160 hour(s))  CBC with Differential/Platelet     Status: Abnormal   Collection Time: 01/08/18 10:20 PM  Result Value Ref Range   WBC 5.8 4.0 - 10.5 K/uL   RBC 5.73 4.22 - 5.81 MIL/uL   Hemoglobin 15.7 13.0 - 17.0 g/dL   HCT 49.9 39.0 - 52.0 %   MCV 87.1 78.0 - 100.0 fL   MCH 27.4 26.0 - 34.0 pg   MCHC 31.5 30.0 - 36.0 g/dL   RDW 16.8 (H) 11.5 - 15.5 %   Platelets 78 (L) 150 - 400 K/uL    Comment: REPEATED TO VERIFY PLATELET COUNT CONFIRMED BY SMEAR    Neutrophils Relative % 46 %   Neutro Abs 2.7 1.7 - 7.7 K/uL   Lymphocytes Relative 40 %   Lymphs Abs 2.3 0.7 - 4.0 K/uL   Monocytes Relative 10 %   Monocytes Absolute 0.6 0.1 - 1.0 K/uL   Eosinophils Relative 2 %   Eosinophils Absolute 0.1 0.0 - 0.7 K/uL   Basophils Relative 1 %   Basophils Absolute 0.0 0.0 - 0.1 K/uL   Immature Granulocytes 1 %   Abs Immature Granulocytes 0.1 0.0 - 0.1 K/uL    Comment: Performed at [facility], [address], [place], [place] [address]  I-stat troponin, ED     Status: Abnormal   Collection Time: 01/08/18 10:28 PM  Result Value Ref Range   Troponin i, poc 0.10 (HH) 0.00 - 0.08 ng/mL   Comment NOTIFIED PHYSICIAN    Comment 3            Comment: Due to the release kinetics of cTnI, a negative result within the first hours of the onset of symptoms does not rule out myocardial infarction with certainty. If myocardial infarction is still suspected, repeat the test at appropriate intervals.   I-stat chem 8, ed      Status: Abnormal   Collection Time: 01/08/18 10:29 PM  Result Value Ref Range   Sodium 138 135 - 145 mmol/L   Potassium 5.0 3.5 - 5.1 mmol/L   Chloride 106 98 - 111 mmol/L   BUN 18 6 - 20 mg/dL   Creatinine, Ser 1.30 (H) 0.61 - 1.24 mg/dL   Glucose, Bld 177 (H) 70 - 99 mg/dL   Calcium,  Ion 0.98 (L) 1.15 - 1.40 mmol/L   TCO2 21 (L) 22 - 32 mmol/L   Hemoglobin 17.0 13.0 - 17.0 g/dL   HCT 50.0 39.0 - 52.0 %  HIV antibody (Routine Testing)     Status: None   Collection Time: 01/09/18  2:01 AM  Result Value Ref Range   HIV Screen 4th Generation wRfx Non Reactive Non Reactive    Comment: (NOTE) Performed At: [facility] [place], [place] [id number] Rush Farmer MD [id number]   Troponin I (q 6hr x 3)     Status: Abnormal   Collection Time: 01/09/18  2:01 AM  Result Value Ref Range   Troponin I 1.25 (HH) <0.03 ng/mL    Comment: CRITICAL RESULT CALLED TO, READ BACK BY AND VERIFIED WITH: BUCKNER,B RN 01/09/2018 0348 JORDANS Performed at [facility], [place] [address], [place], [place] [address]   Comprehensive metabolic panel     Status: Abnormal   Collection Time: 01/09/18  2:01 AM  Result Value Ref Range   Sodium 140 135 - 145 mmol/L   Potassium 4.3 3.5 - 5.1 mmol/L   Chloride 104 98 - 111 mmol/L    Comment: Please note change in reference range.   CO2 23 22 - 32 mmol/L   Glucose, Bld 155 (H) 70 - 99 mg/dL    Comment: Please note change in reference range.   BUN 12 6 - 20 mg/dL    Comment: Please note change in reference range.   Creatinine, Ser 1.38 (H) 0.61 - 1.24 mg/dL   Calcium 8.9 8.9 - 10.3 mg/dL   Total Protein 7.1 6.5 - 8.1 g/dL   Albumin 3.7 3.5 - 5.0 g/dL   AST 157 (H) 15 - 41 U/L   ALT 124 (H) 0 - 44 U/L    Comment: Please note change in reference range.   Alkaline Phosphatase 72 38 - 126 U/L   Total Bilirubin 0.7 0.3 - 1.2 mg/dL   GFR calc non Af Amer 57 (L) >60 mL/min   GFR calc Af Amer >60 >60 mL/min    Comment: (NOTE) The  eGFR has been calculated using the CKD EPI equation. This calculation has not been validated in all clinical situations. eGFR's persistently <60 mL/min signify possible Chronic Kidney Disease.    Anion gap 13 5 - 15    Comment: Performed at [place] [address] [place], [place] [address]  Lipase, blood     Status: None   Collection Time: 01/09/18  2:01 AM  Result Value Ref Range   Lipase 36 11 - 51 U/L    Comment: Performed at [place] [facility] [address] [place], [place] [address]  MRSA PCR Screening     Status: None   Collection Time: 01/09/18  2:44 AM  Result Value Ref Range   MRSA by PCR NEGATIVE NEGATIVE    Comment:        The GeneXpert MRSA Assay (FDA approved for NASAL specimens only), is one component of a comprehensive MRSA colonization surveillance program. It is not intended to diagnose MRSA infection nor to guide or monitor treatment for MRSA infections. Performed at [facility], [place] [address], [place], [place] [address]   Heparin level (unfractionated)     Status: None   Collection Time: 01/09/18  6:05 AM  Result Value Ref Range   Heparin Unfractionated 0.36 0.30 - 0.70 IU/mL    Comment: (NOTE) If heparin results are  below expected values, and patient dosage has  been confirmed, suggest follow up testing of antithrombin III levels. Performed at [facility], [place] [address], [place], [place] [address]   CBC     Status: Abnormal   Collection Time: 01/09/18  6:05 AM  Result Value Ref Range   WBC 6.9 4.0 - 10.5 K/uL   RBC 5.54 4.22 - 5.81 MIL/uL   Hemoglobin 15.2 13.0 - 17.0 g/dL   HCT 46.3 39.0 - 52.0 %   MCV 83.6 78.0 - 100.0 fL   MCH 27.4 26.0 - 34.0 pg   MCHC 32.8 30.0 - 36.0 g/dL   RDW 15.9 (H) 11.5 - 15.5 %   Platelets 84 (L) 150 - 400 K/uL    Comment: CONSISTENT WITH PREVIOUS RESULT Performed at [facility], [place] [address] [place], [place] [address]   Troponin I (q 6hr x 3)     Status: Abnormal    Collection Time: 01/09/18  6:05 AM  Result Value Ref Range   Troponin I 2.02 (HH) <0.03 ng/mL    Comment: CRITICAL VALUE NOTED.  VALUE IS CONSISTENT WITH PREVIOUSLY REPORTED AND CALLED VALUE. Performed at [facility], [place] [address], [place], [place] [address]   Troponin I (q 6hr x 3)     Status: Abnormal   Collection Time: 01/09/18  1:35 PM  Result Value Ref Range   Troponin I 2.03 (HH) <0.03 ng/mL    Comment: CRITICAL VALUE NOTED.  VALUE IS CONSISTENT WITH PREVIOUSLY REPORTED AND CALLED VALUE. Performed at [facility], [place] [address], [place], [place] [address]   Heparin level (unfractionated)     Status: None   Collection Time: 01/09/18  1:35 PM  Result Value Ref Range   Heparin Unfractionated 0.36 0.30 - 0.70 IU/mL    Comment: (NOTE) If heparin results are below expected values, and patient dosage has  been confirmed, suggest follow up testing of antithrombin III levels. Performed at [facility], [place] [address], [place], [place] [address]   ECHOCARDIOGRAM COMPLETE     Status: None   Collection Time: 01/09/18  1:48 PM  Result Value Ref Range   Weight 3,740.8 oz   Height 73 in   BP 119/74 mmHg  Heparin level (unfractionated)     Status: None   Collection Time: 01/10/18  4:25 AM  Result Value Ref Range   Heparin Unfractionated 0.34 0.30 - 0.70 IU/mL    Comment: (NOTE) If heparin results are below expected values, and patient dosage has  been confirmed, suggest follow up testing of antithrombin III levels. Performed at [facility], [place] [address], [place], [place] [address]   CBC     Status: Abnormal   Collection Time: 01/10/18  4:25 AM  Result Value Ref Range   WBC 7.5 4.0 - 10.5 K/uL   RBC 5.65 4.22 - 5.81 MIL/uL   Hemoglobin 15.5 13.0 - 17.0 g/dL   HCT 47.2 39.0 - 52.0 %   MCV 83.5 78.0 - 100.0 fL   MCH 27.4 26.0 - 34.0 pg   MCHC 32.8 30.0 - 36.0 g/dL   RDW 16.3 (H) 11.5 - 15.5 %   Platelets 105 (L) 150 - 400 K/uL    Comment:  CONSISTENT WITH PREVIOUS RESULT Performed at [facility], [place] [address], [place], [place] [address]     Assessment/Plan: Acute saddle pulmonary embolism with acute cor pulmonale (HCC)  -Discussed referral to heme-onc for  hypercoagulable work-up -We will continue Xarelto for at least 6 months -Given handout on PE - Plan: rivaroxaban (XARELTO) 20 MG TABS tablet, Ambulatory referral to Hematology  Essential hypertension -Elevated -Discussed the importance of decreasing sodium intake, increasing p.o. intake of vegetables and water, increasing physical activity -Given handout -Patient encouraged to obtain BP cuff for home use to keep a log of his BP readings. - Plan: amLODipine (NORVASC) 5 MG tablet, DISCONTINUED: amLODipine (NORVASC) 5 MG tablet  Encounter to establish care -We reviewed the PMH, PSH, FH, SH, Meds and Allergies. -We provided refills for any medications we will prescribe as needed. -We addressed current concerns per orders and patient instructions. -We have asked for records for pertinent exams, studies, vaccines and notes from previous providers. -We have advised patient to follow up per instructions below.  Left foot pain  -Recent cellulitis s/p doxycycline with continued edema and pain. -No erythema noted today.  Will proceed with labs and x-ray - Plan: DG Foot Complete Left, CBC (no diff), Uric Acid   Follow-up in 2 to 4 weeks for BP and blood pain   More than 50% of over 30 minutes spent in total in caring for this patient was spent face-to-face with the patient, counseling and/or coordinating care.   Grier Mitts, MD

## 2018-02-04 NOTE — Patient Instructions (Signed)
How to Take Your Blood Pressure You can take your blood pressure at home with a machine. You may need to check your blood pressure at home:  To check if you have high blood pressure (hypertension).  To check your blood pressure over time.  To make sure your blood pressure medicine is working.  Supplies needed: You will need a blood pressure machine, or monitor. You can buy one at a drugstore or online. When choosing one:  Choose one with an arm cuff.  Choose one that wraps around your upper arm. Only one finger should fit between your arm and the cuff.  Do not choose one that measures your blood pressure from your wrist or finger.  Your doctor can suggest a monitor. How to prepare Avoid these things for 30 minutes before checking your blood pressure:  Drinking caffeine.  Drinking alcohol.  Eating.  Smoking.  Exercising.  Five minutes before checking your blood pressure:  Pee.  Sit in a dining chair. Avoid sitting in a soft couch or armchair.  Be quiet. Do not talk.  How to take your blood pressure Follow the instructions that came with your machine. If you have a digital blood pressure monitor, these may be the instructions: 1. Sit up straight. 2. Place your feet on the floor. Do not cross your ankles or legs. 3. Rest your left arm at the level of your heart. You may rest it on a table, desk, or chair. 4. Pull up your shirt sleeve. 5. Wrap the blood pressure cuff around the upper part of your left arm. The cuff should be 1 inch (2.5 cm) above your elbow. It is best to wrap the cuff around bare skin. 6. Fit the cuff snugly around your arm. You should be able to place only one finger between the cuff and your arm. 7. Put the cord inside the groove of your elbow. 8. Press the power button. 9. Sit quietly while the cuff fills with air and loses air. 10. Write down the numbers on the screen. 11. Wait 2-3 minutes and then repeat steps 1-10.  What do the numbers  mean? Two numbers make up your blood pressure. The first number is called systolic pressure. The second is called diastolic pressure. An example of a blood pressure reading is "120 over 80" (or 120/80). If you are an adult and do not have a medical condition, use this guide to find out if your blood pressure is normal: Normal  First number: below 120.  Second number: below 80. Elevated  First number: 120-129.  Second number: below 80. Hypertension stage 1  First number: 130-139.  Second number: 80-89. Hypertension stage 2  First number: 140 or above.  Second number: 90 or above. Your blood pressure is above normal even if only the top or bottom number is above normal. Follow these instructions at home:  Check your blood pressure as often as your doctor tells you to.  Take your monitor to your next doctor's appointment. Your doctor will: ? Make sure you are using it correctly. ? Make sure it is working right.  Make sure you understand what your blood pressure numbers should be.  Tell your doctor if your medicines are causing side effects. Contact a doctor if:  Your blood pressure keeps being high. Get help right away if:  Your first blood pressure number is higher than 180.  Your second blood pressure number is higher than 120. This information is not intended to replace advice given   to you by your health care provider. Make sure you discuss any questions you have with your health care provider. Document Released: 05/30/2008 Document Revised: 05/15/2016 Document Reviewed: 11/24/2015 Elsevier Interactive Patient Education  2018 [employer].  How to Take Your Blood Pressure Blood pressure is a measurement of how strongly your blood is pressing against the walls of your arteries. Arteries are blood vessels that carry blood from your heart throughout your body. Your health care provider takes your blood pressure at each office visit. You can also take your own blood  pressure at home with a blood pressure machine. You may need to take your own blood pressure:  To confirm a diagnosis of high blood pressure (hypertension).  To monitor your blood pressure over time.  To make sure your blood pressure medicine is working.  Supplies needed: To take your blood pressure, you will need a blood pressure machine. You can buy a blood pressure machine, or blood pressure monitor, at most drugstores or online. There are several types of home blood pressure monitors. When choosing one, consider the following:  Choose a monitor that has an arm cuff.  Choose a monitor that wraps snugly around your upper arm. You should be able to fit only one finger between your arm and the cuff.  Do not choose a monitor that measures your blood pressure from your wrist or finger.  Your health care provider can suggest a reliable monitor that will meet your needs. How to prepare To get the most accurate reading, avoid the following for 30 minutes before you check your blood pressure:  Drinking caffeine.  Drinking alcohol.  Eating.  Smoking.  Exercising.  Five minutes before you check your blood pressure:  Empty your bladder.  Sit quietly without talking in a dining chair, rather than in a soft couch or armchair.  How to take your blood pressure To check your blood pressure, follow the instructions in the manual that came with your blood pressure monitor. If you have a digital blood pressure monitor, the instructions may be as follows: 1. Sit up straight. 2. Place your feet on the floor. Do not cross your ankles or legs. 3. Rest your left arm at the level of your heart on a table or desk or on the arm of a chair. 4. Pull up your shirt sleeve. 5. Wrap the blood pressure cuff around the upper part of your left arm, 1 inch (2.5 cm) above your elbow. It is best to wrap the cuff around bare skin. 6. Fit the cuff snugly around your arm. You should be able to place only one  finger between the cuff and your arm. 7. Position the cord inside the groove of your elbow. 8. Press the power button. 9. Sit quietly while the cuff inflates and deflates. 10. Read the digital reading on the monitor screen and write it down (record it). 11. Wait 2-3 minutes, then repeat the steps, starting at step 1.  What does my blood pressure reading mean? A blood pressure reading consists of a higher number over a lower number. Ideally, your blood pressure should be below 120/80. The first ("top") number is called the systolic pressure. It is a measure of the pressure in your arteries as your heart beats. The second ("bottom") number is called the diastolic pressure. It is a measure of the pressure in your arteries as the heart relaxes. Blood pressure is classified into four stages. The following are the stages for adults who do not have  a short-term serious illness or a chronic condition. Systolic pressure and diastolic pressure are measured in a unit called mm Hg. Normal  Systolic pressure: below 120.  Diastolic pressure: below 80. Elevated  Systolic pressure: 120-129.  Diastolic pressure: below 80. Hypertension stage 1  Systolic pressure: 130-139.  Diastolic pressure: 80-89. Hypertension stage 2  Systolic pressure: 140 or above.  Diastolic pressure: 90 or above. You can have prehypertension or hypertension even if only the systolic or only the diastolic number in your reading is higher than normal. Follow these instructions at home:  Check your blood pressure as often as recommended by your health care provider.  Take your monitor to the next appointment with your health care provider to make sure: ? That you are using it correctly. ? That it provides accurate readings.  Be sure you understand what your goal blood pressure numbers are.  Tell your health care provider if you are having any side effects from blood pressure medicine. Contact a health care provider  if:  Your blood pressure is consistently high. Get help right away if:  Your systolic blood pressure is higher than 180.  Your diastolic blood pressure is higher than 110. This information is not intended to replace advice given to you by your health care provider. Make sure you discuss any questions you have with your health care provider. Document Released: 11/24/2015 Document Revised: 02/06/2016 Document Reviewed: 11/24/2015 Elsevier Interactive Patient Education  2018 [employer].  Managing Your Hypertension Hypertension is commonly called high blood pressure. This is when the force of your blood pressing against the walls of your arteries is too strong. Arteries are blood vessels that carry blood from your heart throughout your body. Hypertension forces the heart to work harder to pump blood, and may cause the arteries to become narrow or stiff. Having untreated or uncontrolled hypertension can cause heart attack, stroke, kidney disease, and other problems. What are blood pressure readings? A blood pressure reading consists of a higher number over a lower number. Ideally, your blood pressure should be below 120/80. The first ("top") number is called the systolic pressure. It is a measure of the pressure in your arteries as your heart beats. The second ("bottom") number is called the diastolic pressure. It is a measure of the pressure in your arteries as the heart relaxes. What does my blood pressure reading mean? Blood pressure is classified into four stages. Based on your blood pressure reading, your health care provider may use the following stages to determine what type of treatment you need, if any. Systolic pressure and diastolic pressure are measured in a unit called mm Hg. Normal  Systolic pressure: below 120.  Diastolic pressure: below 80. Elevated  Systolic pressure: 120-129.  Diastolic pressure: below 80. Hypertension stage 1  Systolic pressure: 130-139.  Diastolic  pressure: 80-89. Hypertension stage 2  Systolic pressure: 140 or above.  Diastolic pressure: 90 or above. What health risks are associated with hypertension? Managing your hypertension is an important responsibility. Uncontrolled hypertension can lead to:  A heart attack.  A stroke.  A weakened blood vessel (aneurysm).  Heart failure.  Kidney damage.  Eye damage.  Metabolic syndrome.  Memory and concentration problems.  What changes can I make to manage my hypertension? Hypertension can be managed by making lifestyle changes and possibly by taking medicines. Your health care provider will help you make a plan to bring your blood pressure within a normal range. Eating and drinking  Eat a diet that is  high in fiber and potassium, and low in salt (sodium), added sugar, and fat. An example eating plan is called the DASH (Dietary Approaches to Stop Hypertension) diet. To eat this way: ? Eat plenty of fresh fruits and vegetables. Try to fill half of your plate at each meal with fruits and vegetables. ? Eat whole grains, such as whole wheat pasta, brown rice, or whole grain bread. Fill about one quarter of your plate with whole grains. ? Eat low-fat diary products. ? Avoid fatty cuts of meat, processed or cured meats, and poultry with skin. Fill about one quarter of your plate with lean proteins such as fish, chicken without skin, beans, eggs, and tofu. ? Avoid premade and processed foods. These tend to be higher in sodium, added sugar, and fat.  Reduce your daily sodium intake. Most people with hypertension should eat less than 1,500 mg of sodium a day.  Limit alcohol intake to no more than 1 drink a day for nonpregnant women and 2 drinks a day for men. One drink equals 12 oz of beer, 5 oz of wine, or 1 oz of hard liquor. Lifestyle  Work with your health care provider to maintain a healthy body weight, or to lose weight. Ask what an ideal weight is for you.  Get at least 30  minutes of exercise that causes your heart to beat faster (aerobic exercise) most days of the week. Activities may include walking, swimming, or biking.  Include exercise to strengthen your muscles (resistance exercise), such as weight lifting, as part of your weekly exercise routine. Try to do these types of exercises for 30 minutes at least 3 days a week.  Do not use any products that contain nicotine or tobacco, such as cigarettes and e-cigarettes. If you need help quitting, ask your health care provider.  Control any long-term (chronic) conditions you have, such as high cholesterol or diabetes. Monitoring  Monitor your blood pressure at home as told by your health care provider. Your personal target blood pressure may vary depending on your medical conditions, your age, and other factors.  Have your blood pressure checked regularly, as often as told by your health care provider. Working with your health care provider  Review all the medicines you take with your health care provider because there may be side effects or interactions.  Talk with your health care provider about your diet, exercise habits, and other lifestyle factors that may be contributing to hypertension.  Visit your health care provider regularly. Your health care provider can help you create and adjust your plan for managing hypertension. Will I need medicine to control my blood pressure? Your health care provider may prescribe medicine if lifestyle changes are not enough to get your blood pressure under control, and if:  Your systolic blood pressure is 130 or higher.  Your diastolic blood pressure is 80 or higher.  Take medicines only as told by your health care provider. Follow the directions carefully. Blood pressure medicines must be taken as prescribed. The medicine does not work as well when you skip doses. Skipping doses also puts you at risk for problems. Contact a health care provider if:  You think you are  having a reaction to medicines you have taken.  You have repeated (recurrent) headaches.  You feel dizzy.  You have swelling in your ankles.  You have trouble with your vision. Get help right away if:  You develop a severe headache or confusion.  You have unusual weakness or numbness,  or you feel faint.  You have severe pain in your chest or abdomen.  You vomit repeatedly.  You have trouble breathing. Summary  Hypertension is when the force of blood pumping through your arteries is too strong. If this condition is not controlled, it may put you at risk for serious complications.  Your personal target blood pressure may vary depending on your medical conditions, your age, and other factors. For most people, a normal blood pressure is less than 120/80.  Hypertension is managed by lifestyle changes, medicines, or both. Lifestyle changes include weight loss, eating a healthy, low-sodium diet, exercising more, and limiting alcohol. This information is not intended to replace advice given to you by your health care provider. Make sure you discuss any questions you have with your health care provider. Document Released: 03/11/2012 Document Revised: 05/15/2016 Document Reviewed: 05/15/2016 Elsevier Interactive Patient Education  2018 [employer].  Pulmonary Embolism A pulmonary embolism (PE) is a sudden blockage or decrease of blood flow in one lung or both lungs. Most blockages come from a blood clot that forms in a lower leg, thigh, or arm vein (deep vein thrombosis, DVT) and travels to the lungs. A clot is blood that has thickened into a gel or solid. PE is a dangerous and life-threatening condition that needs to be treated right away. What are the causes? This condition is usually caused by a blood clot that forms in a vein and moves to the lungs. In rare cases, it may be caused by air, fat, part of a tumor, or other tissue that moves through the veins and into the lungs. What  increases the risk? The following factors may make you more likely to develop this condition:  Having DVT or a history of DVT.  Being older than age 51.  Personal or family history of blood clots or blood clotting disease.  Major or lengthy surgery.  Orthopedic surgery, especially hip or knee replacement.  Traumatic injury, such as breaking a hip or leg.  Spinal cord injury.  Stroke.  Taking medicines that contain estrogen. These include birth control pills and hormone replacement therapy.  Long-term (chronic) lung or heart disease.  Cancer and chemotherapy.  Having a central venous catheter.  Pregnancy and the period after delivery.  What are the signs or symptoms? Symptoms of this condition usually start suddenly and include:  Shortness of breath while active or at rest.  Coughing or coughing up blood or blood-tinged mucus.  Chest pain that is often worse with deep breaths.  Rapid or irregular heartbeat.  Feeling light-headed or dizzy.  Fainting.  Feeling anxious.  Sweating.  Pain and swelling in a leg. This is a symptom of DVT, which can lead to PE.  How is this diagnosed? This condition may be diagnosed based on:  Your medical history.  A physical exam.  Blood tests to check blood oxygen level and how well your blood clots, and a D-dimer blood test, which checks your blood for a substance that is released when a blood clot breaks apart.  CT pulmonary angiogram. This test checks blood flow in and around your lungs.  Ventilation-perfusion scan, also called a lung VQ scan. This test measures air flow and blood flow to the lungs.  Ultrasound of the legs to look for blood clots.  How is this treated? Treatment for this conditions depends on many factors, such as the cause of your PE, your risk for bleeding or developing more clots, and other medical conditions you have.  Treatment aims to remove, dissolve, or stop blood clots from forming or growing  larger. Treatment may include:  Blood thinning medicines (anticoagulants) to stop clots from forming or growing. These medicines may be given as a pill, as an injection, or through an IV tube (infusion).  Medicines that dissolve clots (thrombolytics).  A procedure in which a flexible tube is used to remove a blood clot (embolectomy) or deliver medicine to destroy it (catheter-directed thrombolysis).  A procedure in which a filter is inserted into a large vein that carries blood to the heart (inferior vena cava). This filter (vena cava filter) catches blood clots before they reach the lungs.  Surgery to remove the clot (surgical embolectomy). This is rare.  You may need a combination of immediate, long-term (up to 3 months after diagnosis), and extended (more than 3 months after diagnosis) treatments. Your treatment may continue for several months (maintenance therapy). You and your health care provider will work together to choose the treatment program that is best for you. Follow these instructions at home: If you are taking an anticoagulant medicine:  Take the medicine every day at the same time each day.  Understand what foods and drugs interact with your medicine.  Understand the side effects of this medicine, including excessive bruising or bleeding. Ask your health care provider or pharmacist about other side effects. General instructions  Take over-the-counter and prescription medicines only as told by your health care provider.  Anticoagulant medicines may cause side effects, including easy bruising and difficulty stopping bleeding. If you are prescribed an anticoagulant: ? Hold pressure over cuts for longer than usual. ? Tell your dentist and other health care providers that you are taking anticoagulants before you have any procedure that may cause bleeding. ? Avoid contact sports. ? Be extra careful when handling sharp objects. ? Use a soft toothbrush. Floss with waxed dental  floss. ? Shave with an Neurosurgeon.  Wear a medical alert bracelet or carry a medical alert card that says you have had a PE.  Ask your health care provider when you may return to your normal activities.  Talk with your health care provider about any travel plans. It is important to make sure that you are still able to take your medicine while on trips.  Keep all follow-up visits as told by your health care provider. This is important. How is this prevented? Take these actions to lower your risk of developing another PE:  Exercise regularly. Take frequent walks. For at least 30 minutes every day, engage in: ? Activity that involves moving your arms and legs. ? Activity that encourages good blood flow through your body by increasing your heart rate.  While traveling, drink plenty of water and avoid drinking alcohol. Ask your health care provider if you should wear below-the-knee compression stockings.  Avoid sitting or lying in bed for long periods of time without moving your legs. Exercise your arms and legs every hour during long-distance travel (over 4 hours).  If you are hospitalized or have surgery, ask your health care provider about your risks and what treatments can help prevent blood clots.  Maintain a healthy weight. Ask your health care provider what weight is healthy for you.  If you are a woman who is over age 92, avoid unnecessary use of medicines that contain estrogen, including birth control pills.  Do not use any products that contain nicotine or tobacco, such as cigarettes and e-cigarettes. This is especially important if you take estrogen  medicines. If you need help quitting, ask your health care provider.  See your health care provider for regular checkups. This may include blood tests and ultrasound testing on your legs to check for new blood clots.  Contact a health care provider if:  You missed a dose of your blood thinner medicine. Get help right away  if:  You have new or increased pain, swelling, warmth, or redness in an arm or leg.  You have numbness or tingling in an arm or leg.  You have shortness of breath while active or at rest.  You have chest pain.  You have a rapid or irregular heartbeat.  You feel light-headed or dizzy.  You cough up blood.  You have blood in your vomit, stool, or urine.  You have a fever.  You have abdomen (abdominal) pain.  You have a severe fall or head injury.  You have a severe headache.  You have vision changes.  You cannot move your arms or legs.  You are confused or have memory loss.  You are bleeding for 10 minutes or more, even with strong pressure on the wound. These symptoms may represent a serious problem that is an emergency. Do not wait to see if the symptoms will go away. Get medical help right away. Call your local emergency services (911 in the [place] Do not drive yourself to the hospital. Summary  A pulmonary embolism (PE) is a sudden blockage or decrease of blood flow in one lung or both lungs. PE is a dangerous and life-threatening condition that needs to be treated right away.  Having deep vein thrombosis (DVT) or a history of DVT is the most common risk factor for PE.  Treatments for this condition usually include medicines to thin your blood (anticoagulants) or medicines to break apart blood clots (thrombolytics).  If you are prescribed blood thinners, it is important to take the medicine every single day at the same time each day.  If you have signs of PE or DVT, call your local emergency services (911 in the [place] This information is not intended to replace advice given to you by your health care provider. Make sure you discuss any questions you have with your health care provider. Document Released: 06/14/2000 Document Revised: 07/20/2016 Document Reviewed: 07/20/2016 Elsevier Interactive Patient Education  2018 [employer].

## 2018-02-05 ENCOUNTER — Encounter: Payer: Self-pay | Admitting: Family Medicine

## 2018-02-05 ENCOUNTER — Other Ambulatory Visit: Payer: Self-pay | Admitting: Family Medicine

## 2018-02-05 DIAGNOSIS — M109 Gout, unspecified: Secondary | ICD-10-CM | POA: Insufficient documentation

## 2018-02-05 MED ORDER — PREDNISONE 10 MG PO TABS
ORAL_TABLET | ORAL | 0 refills | Status: DC
Start: 2018-02-05 — End: 2018-03-04

## 2018-02-06 ENCOUNTER — Encounter: Payer: Self-pay | Admitting: Hematology

## 2018-02-06 ENCOUNTER — Telehealth: Payer: Self-pay | Admitting: Family Medicine

## 2018-02-06 NOTE — Telephone Encounter (Signed)
Copied from CRM [id number]. Topic: General - Other >> Feb 06, 2018 11:55 AM Luanna Coleawoud, Jessica L wrote: Reason for CRM: pt called and stated that he would like xray results for foot. Please advise

## 2018-02-09 NOTE — Telephone Encounter (Signed)
Pt requests for his X-ray results for his foot,pt voiced understanding that Dr Salomon FickBanks will be back in the office on Tuesday morning and will call him with the results then

## 2018-02-11 NOTE — Telephone Encounter (Signed)
Previously addressed.

## 2018-02-13 ENCOUNTER — Ambulatory Visit: Payer: Self-pay | Admitting: Nurse Practitioner

## 2018-02-18 ENCOUNTER — Ambulatory Visit: Payer: 59 | Admitting: Adult Health

## 2018-02-27 NOTE — Progress Notes (Signed)
HEMATOLOGY/ONCOLOGY CONSULTATION NOTE  Date of Service: 03/04/2018  Patient Care Team: Deeann Saint, MD as PCP - General (Family Medicine)  CHIEF COMPLAINTS/PURPOSE OF CONSULTATION:  History of Pulmonary Embolism   HISTORY OF PRESENTING ILLNESS:   Christopher Coffey is a wonderful 52 y.o. male who has been referred to [place] by Dr Abbe Amsterdam for evaluation and management of History of Saddle Pulmonary Embolism. The pt reports that he is doing well overall.   The pt had an acute saddle pulmonary embolism on 01/08/18. Ultrasound of the lower extremities revealed an acute DVT in right popliteal and peroneal veins, and obstruction in the peroneal vein of undetermined onset. He received IV Heparin and was discharged on Xarelto.   The pt reports that he was not on blood pressure medications prior to his PE and DVT. He denies being on any medications prior to this.   On the night of his PE, the pt notes that he fainted and is not sure for how long, and when he regained consciousness he felt light headed and subsequently developed chest discomfort and pain. He notes that the first responders put him on oxygen. The pt notes that he began feeling like his normal self again when he arrived at the hospital. He denies any repeated chest pain, SOB, or leg swelling since his discharge.   The pt notes that earlier in the week, while doing push ups he felt overheated that caused him to sit for a while. Other than this, he denies feeling any differently in the 6 months preceding his blood clot. He denies any long distance travel or surgeries and denies taking testosterone replacement or hormone replacement.   Of note prior to the patient's visit today, pt has had VAS [place] LE completed on 01/09/18 with results revealing Right: There is evidence of acute DVT in the Popliteal vein. Ultrasound is unable to distinguish whether obstruction in the Peroneal veins is acute or chronic. No cystic structure found in  the popliteal fossa. Left: There is no evidence of deep vein thrombosis in the lower extremity. No cystic structure found in the popliteal fossa.   Most recent lab results (02/04/18) of CBC is as follows: all values are WNL.  On review of systems, pt reports stable energy levels, sleeping well, stable weight, eating well, and denies blood in the stools, SOB, CP, leg swelling, fevers, chills, night sweats, unexpected weight loss, back pains, problems bleeding, calf pain, joint pain, and any other symptoms.   On PMHx the pt reports newly diagnosed HTN, newly diagnosed gout, recent acute Saddle PE and DVT, and denies previous other blood clots, any surgeries.  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    MEDICAL HISTORY:  Past Medical History:  Diagnosis Date  . DVT (deep venous thrombosis) (HCC)   . Hypertension   . Pulmonary embolism (HCC)     SURGICAL HISTORY: History reviewed. No pertinent surgical history.  SOCIAL HISTORY: Social History   Socioeconomic History  . Marital status: Married    Spouse name: Not on file  . Number of children: Not on file  . Years of education: Not on file  . Highest education level: Not on file  Occupational History  . Not on file  Social Needs  . Financial resource strain: Not on file  . Food  insecurity:    Worry: Not on file    Inability: Not on file  . Transportation needs:    Medical: Not on file    Non-medical: Not on file  Tobacco Use  . Smoking status: Never Smoker  . Smokeless tobacco: Never Used  Substance and Sexual Activity  . Alcohol use: Yes    Comment: Social Drinker-Rarely  . Drug use: Never  . Sexual activity: Yes  Lifestyle  . Physical activity:    Days per week: Not on file    Minutes per session: Not on file  . Stress: Not on file  Relationships  . Social  connections:    Talks on phone: Not on file    Gets together: Not on file    Attends religious service: Not on file    Active member of club or organization: Not on file    Attends meetings of clubs or organizations: Not on file    Relationship status: Not on file  . Intimate partner violence:    Fear of current or ex partner: Not on file    Emotionally abused: Not on file    Physically abused: Not on file    Forced sexual activity: Not on file  Other Topics Concern  . Not on file  Social History Narrative  . Not on file    FAMILY HISTORY: Family History  Problem Relation Age of Onset  . Hypertension Mother   . Deep vein thrombosis Brother     ALLERGIES:  has No Known Allergies.  MEDICATIONS:  Current Outpatient Medications  Medication Sig Dispense Refill  . amLODipine (NORVASC) 5 MG tablet Take 1 tablet (5 mg total) by mouth daily. 30 tablet 5  . rivaroxaban (XARELTO) 20 MG TABS tablet Take 1 tablet (20 mg total) by mouth daily with supper. 90 tablet 2  . acetaminophen (TYLENOL) 500 MG tablet Take 500 mg by mouth every 6 (six) hours as needed for mild pain.     No current facility-administered medications for this visit.     REVIEW OF SYSTEMS:    10 Point review of Systems was done is negative except as noted above.  PHYSICAL EXAMINATION:  . Vitals:   03/04/18 1101  BP: (!) 146/90  Pulse: 68  Resp: 18  Temp: 98.1 F (36.7 C)  SpO2: 99%   Filed Weights   03/04/18 1101  Weight: 227 lb 3.2 oz (103.1 kg)   .Body mass index is 29.98 kg/m.  GENERAL:alert, in no acute distress and comfortable SKIN: no acute rashes, no significant lesions EYES: conjunctiva are pink and non-injected, sclera anicteric OROPHARYNX: MMM, no exudates, no oropharyngeal erythema or ulceration NECK: supple, no JVD LYMPH:  no palpable lymphadenopathy in the cervical, axillary or inguinal regions LUNGS: clear to auscultation b/l with normal respiratory effort HEART: regular rate &  rhythm ABDOMEN:  normoactive bowel sounds , non tender, not distended. Extremity: no pedal edema PSYCH: alert & oriented x 3 with fluent speech NEURO: no focal motor/sensory deficits  LABORATORY DATA:  I have reviewed the data as listed  . CBC Latest Ref Rng & Units 03/04/2018 02/04/2018 01/10/2018  WBC 4.0 - 10.3 K/uL 3.9(L) 4.3 7.5  Hemoglobin 13.0 - 17.1 g/dL 62.916.4 52.816.2 41.315.5  Hematocrit 38.4 - 49.9 % 50.9(H) 48.7 47.2  Platelets 140 - 400 K/uL 168 198.0 105(L)    . CMP Latest Ref Rng & Units 03/04/2018 01/09/2018 01/08/2018  Glucose 70 - 99 mg/dL 84 244(W155(H) 102(V177(H)  BUN 6 - 20 mg/dL  12 12 18   Creatinine 0.61 - 1.24 mg/dL 1.61 0.96(E) 4.54(U)  Sodium 135 - 145 mmol/L 140 140 138  Potassium 3.5 - 5.1 mmol/L 3.7 4.3 5.0  Chloride 98 - 111 mmol/L 106 104 106  CO2 22 - 32 mmol/L 25 23 -  Calcium 8.9 - 10.3 mg/dL 9.6 8.9 -  Total Protein 6.5 - 8.1 g/dL 8.0 7.1 -  Total Bilirubin 0.3 - 1.2 mg/dL 0.6 0.7 -  Alkaline Phos 38 - 126 U/L 72 72 -  AST 15 - 41 U/L 24 157(H) -  ALT 0 - 44 U/L 31 124(H) -     RADIOGRAPHIC STUDIES: I have personally reviewed the radiological images as listed and agreed with the findings in the report. Dg Foot Complete Left  Result Date: 02/04/2018 CLINICAL DATA:  Left foot edema, erythema, and pain for 2 weeks associated with increased walking. EXAM: LEFT FOOT - COMPLETE 3+ VIEW COMPARISON:  None. FINDINGS: No fracture or dislocation is identified. Joint space widths are preserved without significant arthropathic changes seen. Mild soft tissue swelling and subcutaneous fat reticulation are present. IMPRESSION: Soft tissue swelling without acute osseous abnormality. Electronically Signed   By: Sebastian Ache M.D.   On: 02/04/2018 17:00    ASSESSMENT & PLAN:  52 y.o. male with   1. Recent Acute Saddle Pulmonary Embolism on 01/08/18 No overt provoking factors. PLAN -Discussed patient's most recent labs from 02/04/18, blood counts normal -Discussed 01/09/18 VAS [place] LE  revealed Right: There is evidence of acute DVT in the Popliteal vein. Ultrasound is unable to distinguish whether obstruction in the Peroneal veins is acute or chronic. No cystic structure found in the popliteal fossa. Left: There is no evidence of deep vein thrombosis in the lower extremity.  -Discussed the recommendation to proceed with anticoagulation as a function of provoked vs unprovoked clots  -Discussed that the pt's blood clots qualifies as an unprovoked clot -Will order blood tests and evaluate genetic markers and acquired factors  -Discussed that because of the severity of the clot, my recommendation would be to remain on life long blood thinners unless otherwise indicated with problems bleeding  -Recommend continuing BP medication as managed by PCP Dr. Salomon Fick -Recommend age-appropriate cancer screening as directed by PCP Dr. Salomon Fick, including obtaining a colonoscopy -Discussed risk management strategies to help prevent clots including walking around, staying well hydrated, wearing compression socks while on long distance travels, avoiding excess alcohol and caffeine use, and avoiding unnecessary hormone replacement -Recently diagnosed gout could be from clot breakdown -Continue Xarelto -Blood tests today and will see the pt back in 4 weeks    Labs today RTC with Dr Candise Che as needed based on labs   All of the patients questions were answered with apparent satisfaction. The patient knows to call the clinic with any problems, questions or concerns.  The total time spent in the appt was 45 minutes and more than 50% was on counseling and direct patient cares.     Wyvonnia Lora MD MS AAHIVMS [facility] Hematology/Oncology Physician [facility]  (Office):       [phone] (Work cell):  [phone] (Fax):           [phone]  03/04/2018 11:54 AM  I, Marcelline Mates, am acting as a scribe for Dr. Candise Che  .I have reviewed the above documentation for accuracy and completeness,  and I agree with the above. Johney Maine MD

## 2018-03-04 ENCOUNTER — Inpatient Hospital Stay: Payer: 59 | Attending: Hematology | Admitting: Hematology

## 2018-03-04 ENCOUNTER — Encounter: Payer: Self-pay | Admitting: Hematology

## 2018-03-04 ENCOUNTER — Inpatient Hospital Stay: Payer: 59

## 2018-03-04 VITALS — BP 146/90 | HR 68 | Temp 98.1°F | Resp 18 | Ht 73.0 in | Wt 227.2 lb

## 2018-03-04 DIAGNOSIS — M109 Gout, unspecified: Secondary | ICD-10-CM | POA: Diagnosis not present

## 2018-03-04 DIAGNOSIS — D6859 Other primary thrombophilia: Secondary | ICD-10-CM

## 2018-03-04 DIAGNOSIS — I2692 Saddle embolus of pulmonary artery without acute cor pulmonale: Secondary | ICD-10-CM | POA: Diagnosis not present

## 2018-03-04 DIAGNOSIS — Z7901 Long term (current) use of anticoagulants: Secondary | ICD-10-CM | POA: Diagnosis not present

## 2018-03-04 DIAGNOSIS — I2602 Saddle embolus of pulmonary artery with acute cor pulmonale: Secondary | ICD-10-CM

## 2018-03-04 DIAGNOSIS — I824Y1 Acute embolism and thrombosis of unspecified deep veins of right proximal lower extremity: Secondary | ICD-10-CM

## 2018-03-04 DIAGNOSIS — I82431 Acute embolism and thrombosis of right popliteal vein: Secondary | ICD-10-CM | POA: Insufficient documentation

## 2018-03-04 LAB — CMP (CANCER CENTER ONLY)
ALT: 31 U/L (ref 0–44)
AST: 24 U/L (ref 15–41)
Albumin: 4.2 g/dL (ref 3.5–5.0)
Alkaline Phosphatase: 72 U/L (ref 38–126)
Anion gap: 9 (ref 5–15)
BUN: 12 mg/dL (ref 6–20)
CHLORIDE: 106 mmol/L (ref 98–111)
CO2: 25 mmol/L (ref 22–32)
CREATININE: 1.09 mg/dL (ref 0.61–1.24)
Calcium: 9.6 mg/dL (ref 8.9–10.3)
GFR, Est AFR Am: >60 mL/min (ref >60)
GFR, Estimated: >60 mL/min (ref >60)
Glucose, Bld: 84 mg/dL (ref 70–99)
Potassium: 3.7 mmol/L (ref 3.5–5.1)
SODIUM: 140 mmol/L (ref 135–145)
Total Bilirubin: 0.6 mg/dL (ref 0.3–1.2)
Total Protein: 8 g/dL (ref 6.5–8.1)

## 2018-03-04 LAB — CBC WITH DIFFERENTIAL/PLATELET
BASOS ABS: 0 10*3/uL (ref 0.0–0.1)
BASOS PCT: 1 %
EOS ABS: 0.1 10*3/uL (ref 0.0–0.5)
EOS PCT: 2 %
HCT: 50.9 % — ABNORMAL HIGH (ref 38.4–49.9)
HEMOGLOBIN: 16.4 g/dL (ref 13.0–17.1)
Lymphocytes Relative: 36 %
Lymphs Abs: 1.4 10*3/uL (ref 0.9–3.3)
MCH: 27.3 pg (ref 27.2–33.4)
MCHC: 32.3 g/dL (ref 32.0–36.0)
MCV: 84.5 fL (ref 79.3–98.0)
Monocytes Absolute: 0.4 10*3/uL (ref 0.1–0.9)
Monocytes Relative: 10 %
NEUTROS PCT: 51 %
Neutro Abs: 2 10*3/uL (ref 1.5–6.5)
PLATELETS: 168 10*3/uL (ref 140–400)
RBC: 6.03 MIL/uL — AB (ref 4.20–5.82)
RDW: 15.1 % — ABNORMAL HIGH (ref 11.0–14.6)
WBC: 3.9 10*3/uL — AB (ref 4.0–10.3)

## 2018-03-04 LAB — ANTITHROMBIN III: AntiThromb III Func: 90 % (ref 75–120)

## 2018-03-05 LAB — BETA-2-GLYCOPROTEIN I ABS, IGG/M/A: Beta-2 Glyco I IgG: <9 GPI IgG units (ref 0–20)

## 2018-03-05 LAB — CARDIOLIPIN ANTIBODIES, IGG, IGM, IGA
ANTICARDIOLIPIN IGM: 9 [MPL'U]/mL (ref 0–12)
Anticardiolipin IgA: <9 APL U/mL (ref 0–11)
Anticardiolipin IgG: <9 GPL U/mL (ref 0–14)

## 2018-03-06 LAB — PROTEIN C ACTIVITY: Protein C Activity: 134 % (ref 73–180)

## 2018-03-06 LAB — PROTEIN S ACTIVITY: Protein S Activity: 189 % — ABNORMAL HIGH (ref 63–140)

## 2018-03-07 LAB — DRVVT CONFIRM: dRVVT Confirm: 1.9 ratio — ABNORMAL HIGH (ref 0.8–1.2)

## 2018-03-07 LAB — FACTOR 5 LEIDEN

## 2018-03-07 LAB — LUPUS ANTICOAGULANT PANEL
DRVVT: 121 s — ABNORMAL HIGH (ref 0.0–47.0)
PTT Lupus Anticoagulant: 46.6 s (ref 0.0–51.9)

## 2018-03-07 LAB — DRVVT MIX: DRVVT MIX: 80.5 s — AB (ref 0.0–47.0)

## 2018-03-09 LAB — PROTHROMBIN GENE MUTATION

## 2018-03-20 LAB — JAK2 (INCLUDING V617F AND EXON 12), MPL,& CALR-NEXT GEN SEQ

## 2018-08-10 ENCOUNTER — Other Ambulatory Visit: Payer: Self-pay | Admitting: Family Medicine

## 2018-08-10 DIAGNOSIS — I10 Essential (primary) hypertension: Secondary | ICD-10-CM

## 2018-09-10 ENCOUNTER — Other Ambulatory Visit: Payer: Self-pay | Admitting: Family Medicine

## 2018-09-10 DIAGNOSIS — I10 Essential (primary) hypertension: Secondary | ICD-10-CM

## 2018-09-10 NOTE — Telephone Encounter (Signed)
Pt needs an appointment for further refills  

## 2018-10-13 ENCOUNTER — Ambulatory Visit (INDEPENDENT_AMBULATORY_CARE_PROVIDER_SITE_OTHER): Payer: 59 | Admitting: Family Medicine

## 2018-10-13 ENCOUNTER — Other Ambulatory Visit: Payer: Self-pay | Admitting: Family Medicine

## 2018-10-13 ENCOUNTER — Encounter: Payer: Self-pay | Admitting: Family Medicine

## 2018-10-13 ENCOUNTER — Other Ambulatory Visit: Payer: Self-pay

## 2018-10-13 DIAGNOSIS — I10 Essential (primary) hypertension: Secondary | ICD-10-CM

## 2018-10-13 DIAGNOSIS — I2602 Saddle embolus of pulmonary artery with acute cor pulmonale: Secondary | ICD-10-CM | POA: Diagnosis not present

## 2018-10-13 MED ORDER — AMLODIPINE BESYLATE 5 MG PO TABS: 5.0000 mg | ORAL_TABLET | Freq: Every day | ORAL | 2 refills | Status: DC

## 2018-10-13 MED ORDER — RIVAROXABAN 20 MG PO TABS: 20.0000 mg | ORAL_TABLET | Freq: Every day | ORAL | 1 refills | Status: DC

## 2018-10-13 NOTE — Progress Notes (Signed)
Virtual Visit via Video Note Able to see pt on Webex, but not able to hear each other.  Pt called on the phone to continue visit, still able to see video via webex.  I connected with Christopher Paradise. Coffey on 10/13/18 at  2:30 PM EDT by a video enabled telemedicine application and verified that I am speaking with the correct person using two identifiers.  Location patient: home Location provider:home office Persons participating in the virtual visit: patient, provider  I discussed the limitations of evaluation and management by telemedicine and the availability of in person appointments. The patient expressed understanding and agreed to proceed.   HPI: Pt is following up on bp.  States has been doing well.  Needs refills on Norvasc. Not checking bp, not monitoring sodium intake.  Pt is exercising by walking and doing push ups.  Denies HAs, changes in vision, CP.  Pt also needs refill on xarelto.  Pt s/p saddle PE in July 2019.  Pt states pharmacy gave him a one month refill on xarelto as a courtesy.  Pt also notes he never heard anything else from Oncology after visit in September 2019.  Denies SOB.   ROS: See pertinent positives and negatives per HPI.  Past Medical History:  Diagnosis Date  . DVT (deep venous thrombosis) (HCC)   . Hypertension   . Pulmonary embolism (HCC)     No past surgical history on file.  Family History  Problem Relation Age of Onset  . Hypertension Mother   . Deep vein thrombosis Brother     SOCIAL HX:  Working from home.   Current Outpatient Medications:  .  acetaminophen (TYLENOL) 500 MG tablet, Take 500 mg by mouth every 6 (six) hours as needed for mild pain., Disp: , Rfl:  .  amLODipine (NORVASC) 5 MG tablet, TAKE 1 TABLET(5 MG) BY MOUTH DAILY, Disp: 30 tablet, Rfl: 0 .  rivaroxaban (XARELTO) 20 MG TABS tablet, Take 1 tablet (20 mg total) by mouth daily with supper., Disp: 90 tablet, Rfl: 2  EXAM:  VITALS per patient if applicable:  RR between 12-20  bpm  GENERAL: alert, oriented, appears well and in no acute distress  HEENT: atraumatic, conjunctiva clear, no obvious abnormalities on inspection of external nose and ears  NECK: normal movements of the head and neck  LUNGS: on inspection no signs of respiratory distress, breathing rate appears normal, no obvious gross SOB, gasping or wheezing  CV: no obvious cyanosis  MS: moves all visible extremities without noticeable abnormality  PSYCH/NEURO: pleasant and cooperative, no obvious depression or anxiety, speech and thought processing grossly intact  ASSESSMENT AND PLAN:  Discussed the following assessment and plan:  Essential hypertension  -pt advised to obtain bp cuff for home use. -discussed lifestyle modifications - Plan: amLODipine (NORVASC) 5 MG tablet  Acute saddle pulmonary embolism with acute cor pulmonale (HCC)  -pt advised to contact Oncology in regards to test results/f/u.  Per chart review 1 month f/u was advised. -will likely need indefinite anticoagulation given degree of unprovoked PE. - Plan: rivaroxaban (XARELTO) 20 MG TABS tablet  F/u in 1-2 months.  Discussed obtaining BMP at that time.   I discussed the assessment and treatment plan with the patient. The patient was provided an opportunity to ask questions and all were answered. The patient agreed with the plan and demonstrated an understanding of the instructions.   The patient was advised to call back or seek an in-person evaluation if the symptoms worsen or if  the condition fails to improve as anticipated.   Christopher Ruddy, MD

## 2018-10-13 NOTE — Telephone Encounter (Signed)
Pt has a web ex appointment scheduled for his Bp med refill

## 2019-05-19 ENCOUNTER — Other Ambulatory Visit: Payer: Self-pay | Admitting: Family Medicine

## 2019-05-19 DIAGNOSIS — I2602 Saddle embolus of pulmonary artery with acute cor pulmonale: Secondary | ICD-10-CM

## 2019-07-17 ENCOUNTER — Other Ambulatory Visit: Payer: Self-pay | Admitting: Family Medicine

## 2019-07-17 DIAGNOSIS — I10 Essential (primary) hypertension: Secondary | ICD-10-CM

## 2019-07-19 NOTE — Telephone Encounter (Signed)
Pt needs office visit for further refills 

## 2019-08-20 ENCOUNTER — Other Ambulatory Visit: Payer: Self-pay | Admitting: Family Medicine

## 2019-08-20 DIAGNOSIS — I10 Essential (primary) hypertension: Secondary | ICD-10-CM

## 2019-08-20 NOTE — Telephone Encounter (Signed)
Pt needs appointment for further refills 

## 2019-09-23 ENCOUNTER — Other Ambulatory Visit: Payer: Self-pay | Admitting: Family Medicine

## 2019-09-23 DIAGNOSIS — I10 Essential (primary) hypertension: Secondary | ICD-10-CM

## 2019-09-23 NOTE — Telephone Encounter (Signed)
Pt needs appointment for further refills 

## 2019-10-26 ENCOUNTER — Other Ambulatory Visit: Payer: Self-pay | Admitting: Family Medicine

## 2019-10-26 DIAGNOSIS — I2602 Saddle embolus of pulmonary artery with acute cor pulmonale: Secondary | ICD-10-CM

## 2019-10-26 DIAGNOSIS — I10 Essential (primary) hypertension: Secondary | ICD-10-CM

## 2019-10-26 NOTE — Telephone Encounter (Signed)
Pt is scheduled for a MyChart video visit on 10/29/2019 at 8 am for f/u on Xarelto

## 2019-10-27 NOTE — Telephone Encounter (Signed)
Spoke with pt state that he has enough Xarelto to has to his visit with dr Salomon Fick

## 2019-10-29 ENCOUNTER — Encounter: Payer: Self-pay | Admitting: Family Medicine

## 2019-10-29 ENCOUNTER — Telehealth (INDEPENDENT_AMBULATORY_CARE_PROVIDER_SITE_OTHER): Payer: 59 | Admitting: Family Medicine

## 2019-10-29 DIAGNOSIS — I10 Essential (primary) hypertension: Secondary | ICD-10-CM | POA: Diagnosis not present

## 2019-10-29 DIAGNOSIS — Z86711 Personal history of pulmonary embolism: Secondary | ICD-10-CM | POA: Diagnosis not present

## 2019-10-29 MED ORDER — AMLODIPINE BESYLATE 5 MG PO TABS: ORAL_TABLET | ORAL | 1 refills | Status: DC

## 2019-10-29 NOTE — Progress Notes (Signed)
Virtual Visit via Video Note  I connected with Christopher Coffey on 10/29/19 at  8:00 AM EDT by a video enabled telemedicine application 2/2 COVID-19 pandemic and verified that I am speaking with the correct person using two identifiers.  Location patient: home Location provider:work or home office Persons participating in the virtual visit: patient, provider  I discussed the limitations of evaluation and management by telemedicine and the availability of in person appointments. The patient expressed understanding and agreed to proceed.   HPI: Patient is a 54 year old male with pmh sig for HTN, gout, saddle PE, DVT seen for refills. Pt on Xarelto for unprovoked saddle PE and DVT in R popliteal and peroneal veins in July 2019. Pt taking norvasc 5 mg for HTN.  Not checking bp at home.  Pt denies CP, dizziness, fatigue, SOB,  LE edema.  ROS: See pertinent positives and negatives per HPI.  Past Medical History:  Diagnosis Date  . DVT (deep venous thrombosis) (HCC)   . Hypertension   . Pulmonary embolism (HCC)     No past surgical history on file.  Family History  Problem Relation Age of Onset  . Hypertension Mother   . Deep vein thrombosis Brother      Current Outpatient Medications:  .  acetaminophen (TYLENOL) 500 MG tablet, Take 500 mg by mouth every 6 (six) hours as needed for mild pain., Disp: , Rfl:  .  amLODipine (NORVASC) 5 MG tablet, TAKE 1 TABLET(5 MG) BY MOUTH DAILY, Disp: 30 tablet, Rfl: 0 .  XARELTO 20 MG TABS tablet, TAKE 1 TABLET(20 MG) BY MOUTH DAILY WITH SUPPER, Disp: 90 tablet, Rfl: 1  EXAM:  VITALS per patient if applicable: RR between 12-20 bpm  GENERAL: alert, oriented, appears well and in no acute distress  HEENT: atraumatic, conjunctiva clear, no obvious abnormalities on inspection of external nose and ears  NECK: normal movements of the head and neck  LUNGS: on inspection no signs of respiratory distress, breathing rate appears normal, no obvious gross  SOB, gasping or wheezing  CV: no obvious cyanosis  MS: moves all visible extremities without noticeable abnormality  PSYCH/NEURO: pleasant and cooperative, no obvious depression or anxiety, speech and thought processing grossly intact  ASSESSMENT AND PLAN:  Discussed the following assessment and plan:  Essential hypertension  -Discussed the importance of checking BP at home -Discussed lifestyle modifications -Patient for BMP and other labs. - Plan: amLODipine (NORVASC) 5 MG tablet  History of pulmonary embolus (PE) -given unprovoked saddle PE and negative work up pt to continue anticoagulation indefinitely -Xarelto refilled via telephone encounter on 10/26/2019 -Pt strongly encouraged to continue follow-up with oncology as last visit was 03/04/2018.  Pt encouraged to schedule CPE, preventive screenings, and labs in the next few weeks.  I discussed the assessment and treatment plan with the patient. The patient was provided an opportunity to ask questions and all were answered. The patient agreed with the plan and demonstrated an understanding of the instructions.   The patient was advised to call back or seek an in-person evaluation if the symptoms worsen or if the condition fails to improve as anticipated.  Deeann Saint, MD

## 2019-11-30 ENCOUNTER — Other Ambulatory Visit: Payer: Self-pay | Admitting: Family Medicine

## 2019-11-30 DIAGNOSIS — I10 Essential (primary) hypertension: Secondary | ICD-10-CM

## 2020-02-02 ENCOUNTER — Other Ambulatory Visit: Payer: Self-pay | Admitting: Family Medicine

## 2020-02-02 DIAGNOSIS — I10 Essential (primary) hypertension: Secondary | ICD-10-CM

## 2020-04-24 IMAGING — DX DG CHEST 1V PORT
1 series · 1 of 1 positions shown · non-contrast
Comparison: None.

CLINICAL DATA: Acute onset crushing chest pain beginning 45 minutes
ago.

EXAM:
PORTABLE CHEST 1 VIEW

[chest ap]
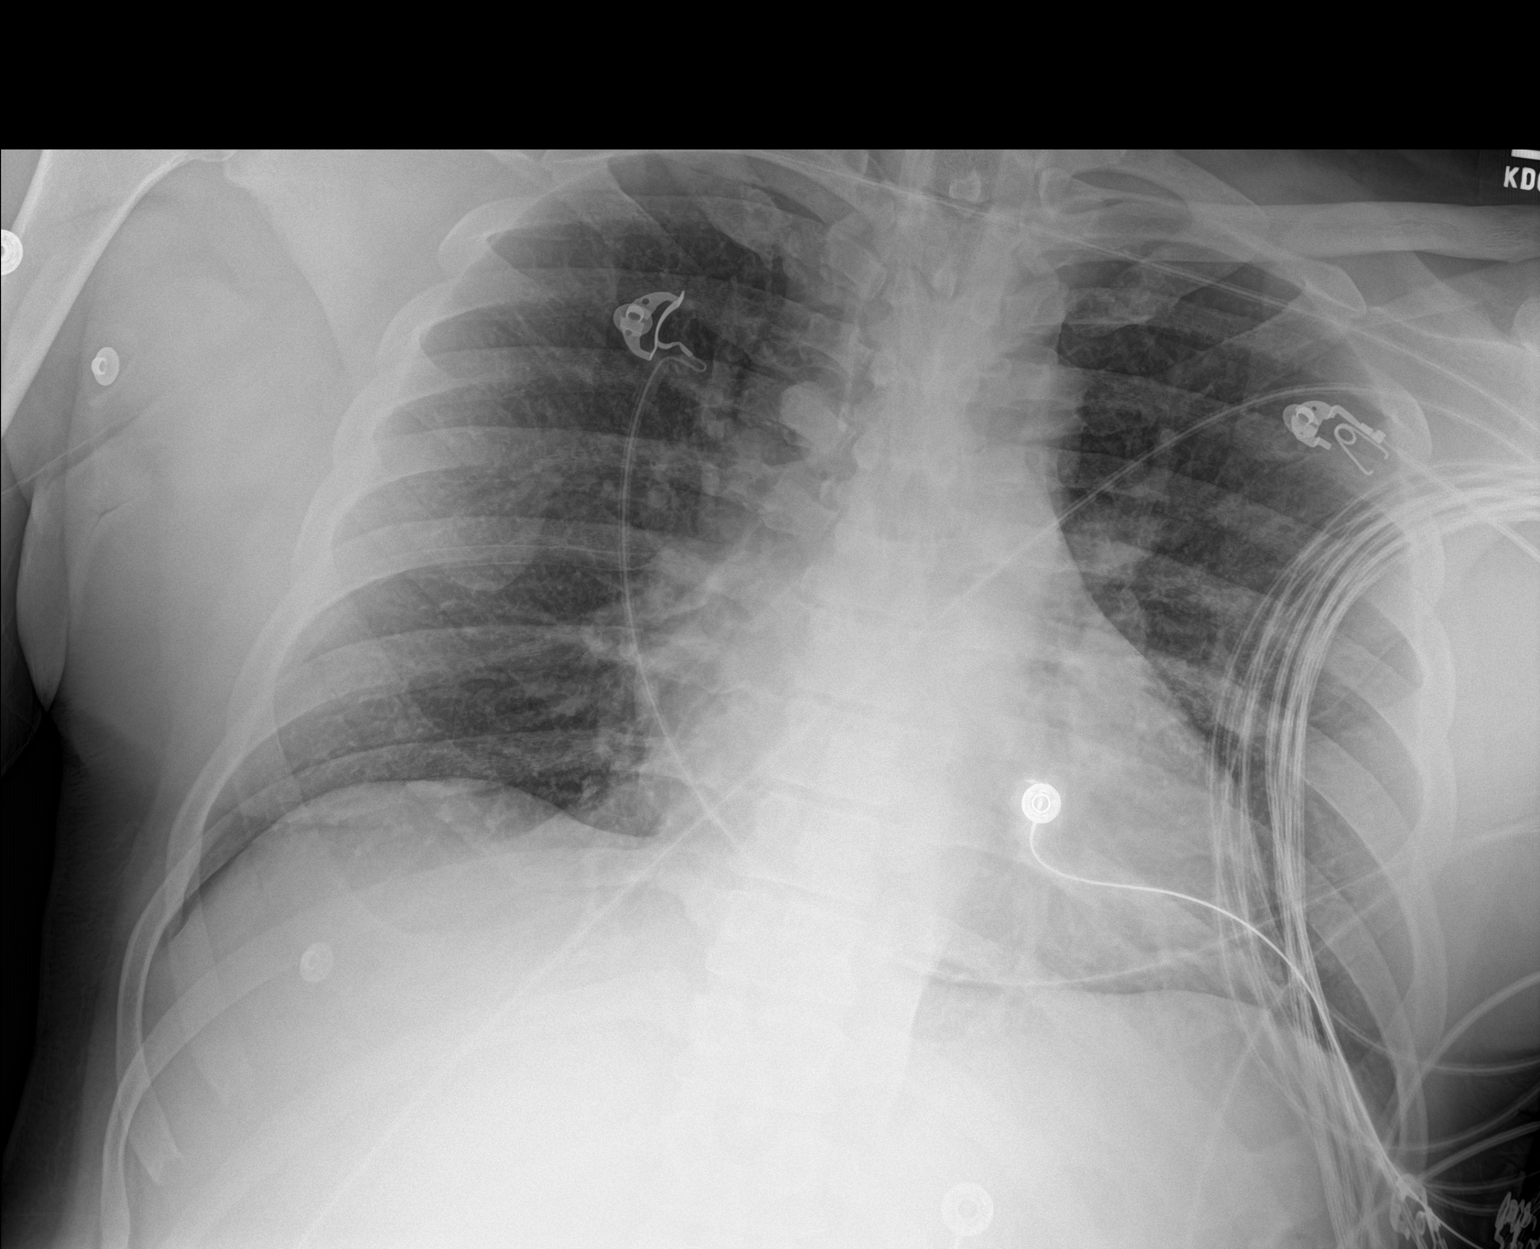

[1 of 1 positions shown; findings below may reference images not displayed]

FINDINGS: Cardiomediastinal silhouette is normal. Pulmonary vascular
congestion. No pleural effusions or focal consolidations. Trachea
projects midline and there is no pneumothorax. Soft tissue planes
and included osseous structures are non-suspicious.
IMPRESSION: Pulmonary vascular congestion.

## 2020-05-20 ENCOUNTER — Other Ambulatory Visit: Payer: Self-pay | Admitting: Family Medicine

## 2020-05-20 DIAGNOSIS — I10 Essential (primary) hypertension: Secondary | ICD-10-CM

## 2020-05-21 IMAGING — DX DG FOOT COMPLETE 3+V*L*
3 series · 3 of 3 positions shown · non-contrast
Comparison: None.

CLINICAL DATA: Left foot edema, erythema, and pain for 2 weeks
associated with increased walking.

EXAM:
LEFT FOOT - COMPLETE 3+ VIEW

[foot ap]
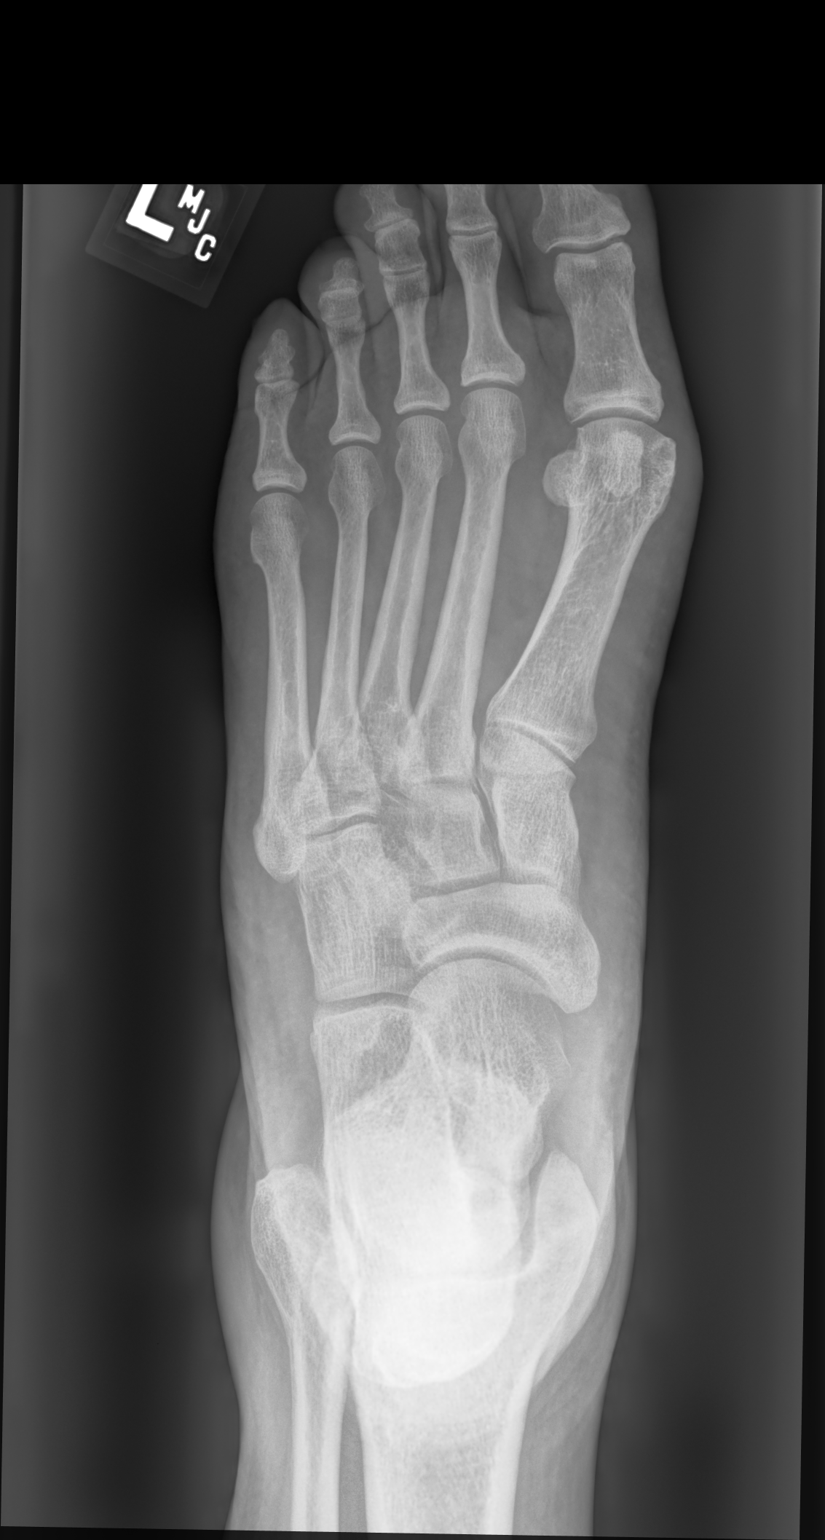

[foot mlo]
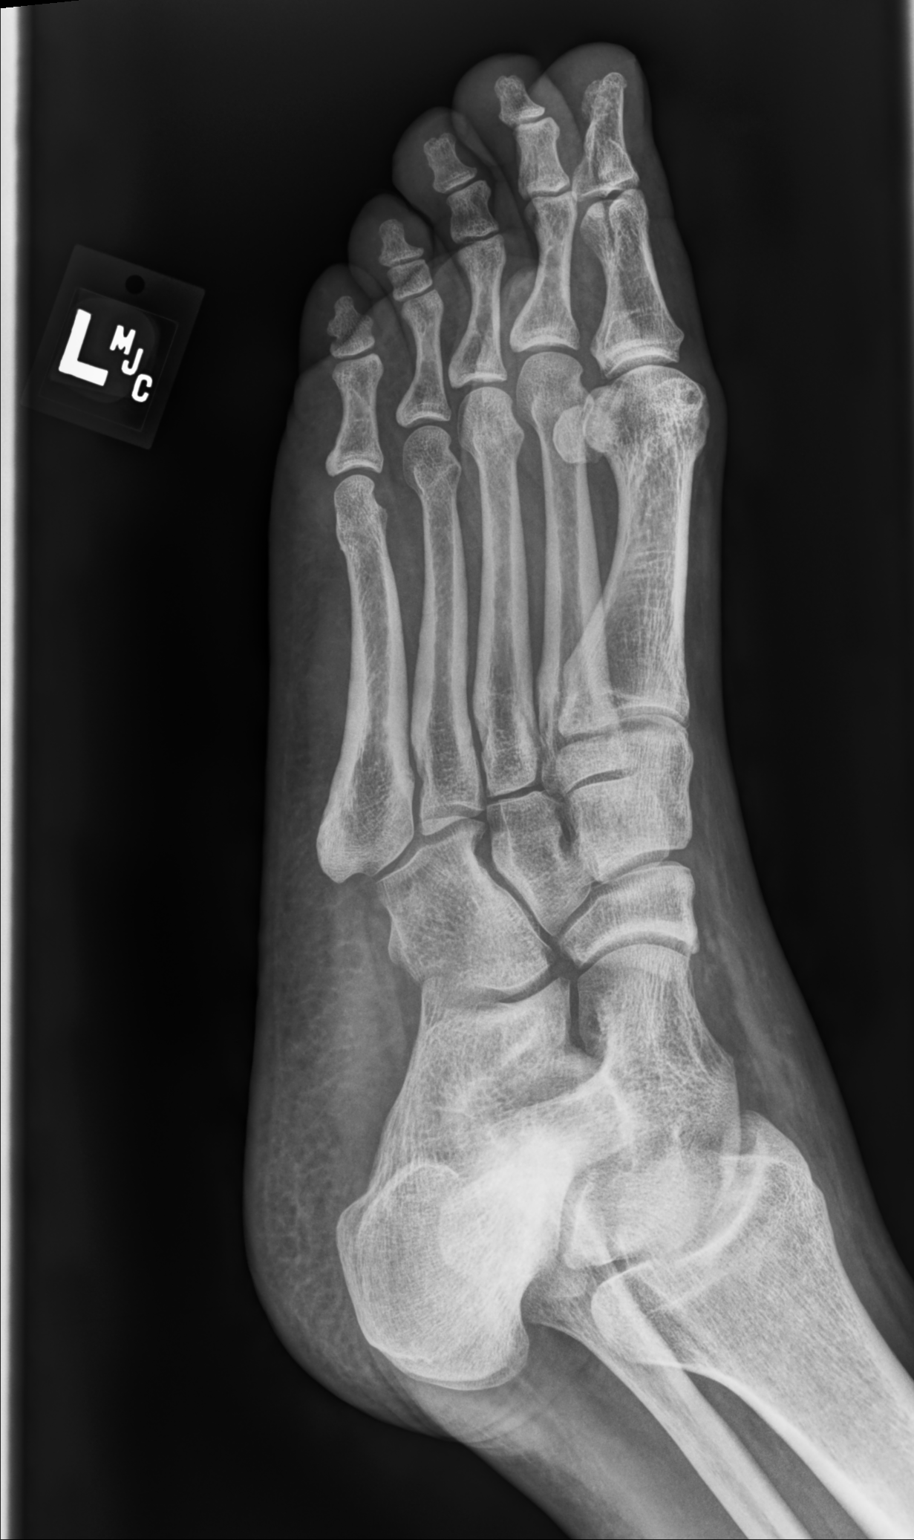

[foot lat]
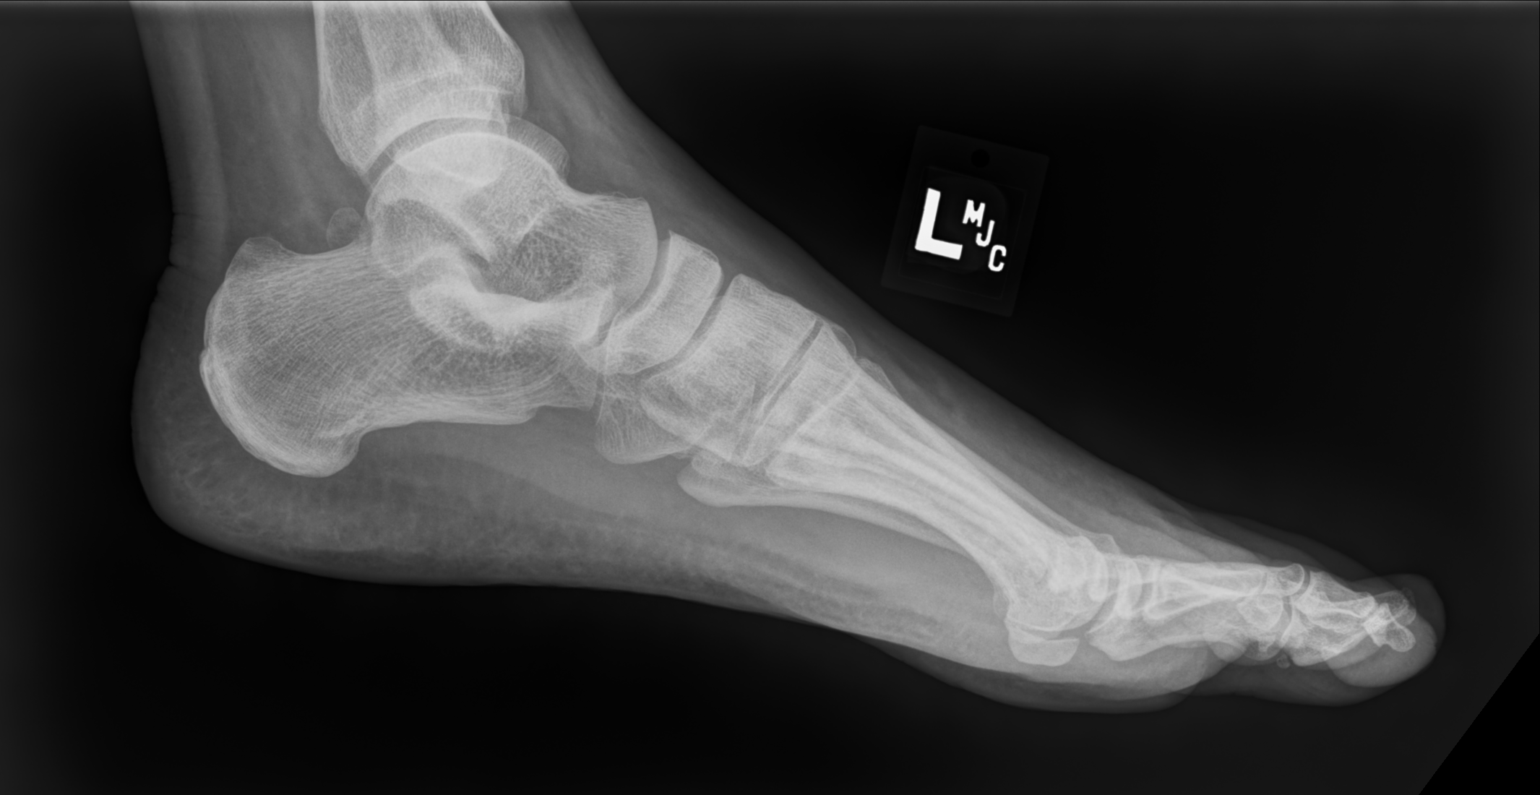

[3 of 3 positions shown; findings below may reference images not displayed]

FINDINGS: No fracture or dislocation is identified. Joint space widths are
preserved without significant arthropathic changes seen. Mild soft
tissue swelling and subcutaneous fat reticulation are present.
IMPRESSION: Soft tissue swelling without acute osseous abnormality.

## 2020-06-18 ENCOUNTER — Other Ambulatory Visit: Payer: Self-pay | Admitting: Family Medicine

## 2020-06-18 DIAGNOSIS — I2602 Saddle embolus of pulmonary artery with acute cor pulmonale: Secondary | ICD-10-CM

## 2020-06-27 ENCOUNTER — Other Ambulatory Visit: Payer: Self-pay | Admitting: Family Medicine

## 2020-06-27 DIAGNOSIS — I10 Essential (primary) hypertension: Secondary | ICD-10-CM

## 2020-08-22 ENCOUNTER — Other Ambulatory Visit: Payer: Self-pay | Admitting: Family Medicine

## 2020-08-22 DIAGNOSIS — I10 Essential (primary) hypertension: Secondary | ICD-10-CM

## 2020-08-22 NOTE — Telephone Encounter (Signed)
Pt needs appointment for further refill 

## 2020-11-20 ENCOUNTER — Other Ambulatory Visit: Payer: Self-pay | Admitting: Family Medicine

## 2020-11-20 DIAGNOSIS — I10 Essential (primary) hypertension: Secondary | ICD-10-CM

## 2020-12-30 ENCOUNTER — Other Ambulatory Visit: Payer: Self-pay | Admitting: Family Medicine

## 2020-12-30 DIAGNOSIS — I2602 Saddle embolus of pulmonary artery with acute cor pulmonale: Secondary | ICD-10-CM

## 2021-02-18 ENCOUNTER — Other Ambulatory Visit: Payer: Self-pay | Admitting: Family Medicine

## 2021-02-18 DIAGNOSIS — I10 Essential (primary) hypertension: Secondary | ICD-10-CM

## 2021-02-19 ENCOUNTER — Other Ambulatory Visit: Payer: Self-pay | Admitting: Family Medicine

## 2021-02-19 DIAGNOSIS — I10 Essential (primary) hypertension: Secondary | ICD-10-CM

## 2021-03-27 ENCOUNTER — Other Ambulatory Visit: Payer: Self-pay | Admitting: Family Medicine

## 2021-03-27 DIAGNOSIS — I10 Essential (primary) hypertension: Secondary | ICD-10-CM

## 2021-04-02 ENCOUNTER — Other Ambulatory Visit: Payer: Self-pay | Admitting: Family Medicine

## 2021-04-02 DIAGNOSIS — I2602 Saddle embolus of pulmonary artery with acute cor pulmonale: Secondary | ICD-10-CM

## 2021-04-04 ENCOUNTER — Telehealth: Payer: Self-pay

## 2021-04-04 NOTE — Telephone Encounter (Signed)
Patient called requesting Rx refill  XARELTO 20 MG TABS tablet amLODipine (NORVASC) 5 MG tablet I informed pt physical is needed for further refills and pt scheduled appt 12/14

## 2021-04-05 ENCOUNTER — Other Ambulatory Visit: Payer: Self-pay | Admitting: Family Medicine

## 2021-04-05 DIAGNOSIS — I2602 Saddle embolus of pulmonary artery with acute cor pulmonale: Secondary | ICD-10-CM

## 2021-04-05 DIAGNOSIS — I10 Essential (primary) hypertension: Secondary | ICD-10-CM

## 2021-04-06 ENCOUNTER — Other Ambulatory Visit: Payer: Self-pay | Admitting: Family Medicine

## 2021-04-06 DIAGNOSIS — I10 Essential (primary) hypertension: Secondary | ICD-10-CM

## 2021-04-06 MED ORDER — AMLODIPINE BESYLATE 5 MG PO TABS: 5.0000 mg | ORAL_TABLET | Freq: Every day | ORAL | 0 refills | Status: DC

## 2021-04-27 MED ORDER — AMLODIPINE BESYLATE 5 MG PO TABS: 5.0000 mg | ORAL_TABLET | Freq: Every day | ORAL | 1 refills | Status: DC

## 2021-04-27 MED ORDER — RIVAROXABAN 20 MG PO TABS: ORAL_TABLET | ORAL | 1 refills | Status: DC

## 2021-04-27 NOTE — Addendum Note (Signed)
Addended by: Kathreen Devoid on: 04/27/2021 02:01 PM   Modules accepted: Orders

## 2021-04-29 ENCOUNTER — Other Ambulatory Visit: Payer: Self-pay | Admitting: Family Medicine

## 2021-04-29 DIAGNOSIS — I10 Essential (primary) hypertension: Secondary | ICD-10-CM

## 2021-05-03 ENCOUNTER — Other Ambulatory Visit: Payer: Self-pay | Admitting: Family Medicine

## 2021-05-03 DIAGNOSIS — I10 Essential (primary) hypertension: Secondary | ICD-10-CM

## 2021-05-03 DIAGNOSIS — I2602 Saddle embolus of pulmonary artery with acute cor pulmonale: Secondary | ICD-10-CM

## 2021-06-13 ENCOUNTER — Ambulatory Visit (INDEPENDENT_AMBULATORY_CARE_PROVIDER_SITE_OTHER): Payer: 59 | Admitting: Family Medicine

## 2021-06-13 ENCOUNTER — Encounter: Payer: Self-pay | Admitting: Family Medicine

## 2021-06-13 VITALS — BP 142/90 | HR 75 | Temp 98.9°F | Ht 73.0 in | Wt 233.4 lb

## 2021-06-13 DIAGNOSIS — Z13220 Encounter for screening for lipoid disorders: Secondary | ICD-10-CM | POA: Diagnosis not present

## 2021-06-13 DIAGNOSIS — Z0000 Encounter for general adult medical examination without abnormal findings: Secondary | ICD-10-CM

## 2021-06-13 DIAGNOSIS — I10 Essential (primary) hypertension: Secondary | ICD-10-CM | POA: Diagnosis not present

## 2021-06-13 DIAGNOSIS — Z125 Encounter for screening for malignant neoplasm of prostate: Secondary | ICD-10-CM | POA: Diagnosis not present

## 2021-06-13 DIAGNOSIS — Z1211 Encounter for screening for malignant neoplasm of colon: Secondary | ICD-10-CM | POA: Diagnosis not present

## 2021-06-13 DIAGNOSIS — Z7901 Long term (current) use of anticoagulants: Secondary | ICD-10-CM

## 2021-06-13 DIAGNOSIS — Z1159 Encounter for screening for other viral diseases: Secondary | ICD-10-CM

## 2021-06-13 LAB — COMPREHENSIVE METABOLIC PANEL
ALT: 27 U/L (ref 0–53)
AST: 24 U/L (ref 0–37)
Albumin: 4.2 g/dL (ref 3.5–5.2)
Alkaline Phosphatase: 62 U/L (ref 39–117)
BUN: 14 mg/dL (ref 6–23)
CO2: 27 mEq/L (ref 19–32)
Calcium: 9.7 mg/dL (ref 8.4–10.5)
Chloride: 104 mEq/L (ref 96–112)
Creatinine, Ser: 1.14 mg/dL (ref 0.40–1.50)
GFR: 72.29 mL/min (ref >60.00)
Glucose, Bld: 94 mg/dL (ref 70–99)
Potassium: 4.4 mEq/L (ref 3.5–5.1)
Sodium: 140 mEq/L (ref 135–145)
Total Bilirubin: 0.5 mg/dL (ref 0.2–1.2)
Total Protein: 7.6 g/dL (ref 6.0–8.3)

## 2021-06-13 LAB — LIPID PANEL
Cholesterol: 216 mg/dL — ABNORMAL HIGH (ref 0–200)
HDL: 51.3 mg/dL (ref >39.00)
LDL Cholesterol: 143 mg/dL — ABNORMAL HIGH (ref 0–99)
NonHDL: 164.88
Total CHOL/HDL Ratio: 4
Triglycerides: 108 mg/dL (ref 0.0–149.0)
VLDL: 21.6 mg/dL (ref 0.0–40.0)

## 2021-06-13 LAB — T4, FREE: Free T4: 0.67 ng/dL (ref 0.60–1.60)

## 2021-06-13 LAB — CBC WITH DIFFERENTIAL/PLATELET
Basophils Absolute: 0 10*3/uL (ref 0.0–0.1)
Basophils Relative: 1 % (ref 0.0–3.0)
Eosinophils Absolute: 0.1 10*3/uL (ref 0.0–0.7)
Eosinophils Relative: 2.2 % (ref 0.0–5.0)
HCT: 50.8 % (ref 39.0–52.0)
Hemoglobin: 16.5 g/dL (ref 13.0–17.0)
Lymphocytes Relative: 34.9 % (ref 12.0–46.0)
Lymphs Abs: 1.4 10*3/uL (ref 0.7–4.0)
MCHC: 32.5 g/dL (ref 30.0–36.0)
MCV: 86.6 fl (ref 78.0–100.0)
Monocytes Absolute: 0.5 10*3/uL (ref 0.1–1.0)
Monocytes Relative: 11.4 % (ref 3.0–12.0)
Neutro Abs: 2 10*3/uL (ref 1.4–7.7)
Neutrophils Relative %: 50.5 % (ref 43.0–77.0)
Platelets: 173 10*3/uL (ref 150.0–400.0)
RBC: 5.87 Mil/uL — ABNORMAL HIGH (ref 4.22–5.81)
RDW: 14.5 % (ref 11.5–15.5)
WBC: 4 10*3/uL (ref 4.0–10.5)

## 2021-06-13 LAB — PSA: PSA: 1.45 ng/mL (ref 0.10–4.00)

## 2021-06-13 LAB — TSH: TSH: 2.31 u[IU]/mL (ref 0.35–5.50)

## 2021-06-13 LAB — HEMOGLOBIN A1C: Hgb A1c MFr Bld: 6.2 % (ref 4.6–6.5)

## 2021-06-13 MED ORDER — AMLODIPINE BESYLATE 10 MG PO TABS
10.0000 mg | ORAL_TABLET | Freq: Every day | ORAL | 3 refills | Status: DC
Start: 2021-06-13 — End: 2021-08-29

## 2021-06-13 NOTE — Patient Instructions (Addendum)
Your blood pressure medication Norvasc was increased from 5 mg to 10 mg a day.  You are encouraged to obtain a blood pressure cuff to check your blood pressure at home and keep a log to bring with you to clinic.  Also work on increasing your physical activity and your intake of water.  For most people OFV change in dosage is well-tolerated.  For some people they may notice swelling in their feet/ankles.  If he notices decreasing the medication back to 5 mg this.  We will have you follow-up in 1 month.

## 2021-06-13 NOTE — Progress Notes (Signed)
Subjective:     Christopher Coffey is a 55 y.o. male and is here for a comprehensive physical exam. The patient reports no problems.  Not fasting, had a banana this morning.  Patient states he is getting closer to retirement.  Patient states he is not exercising or checking BP at home.  Taking Norvasc 5 mg daily.  Endorses eating out and at home.  Unsure how much water he is drinking daily.  Social History   Socioeconomic History   Marital status: Married    Spouse name: Not on file   Number of children: Not on file   Years of education: Not on file   Highest education level: Not on file  Occupational History   Not on file  Tobacco Use   Smoking status: Never   Smokeless tobacco: Never  Vaping Use   Vaping Use: Never used  Substance and Sexual Activity   Alcohol use: Yes    Comment: Social Drinker-Rarely   Drug use: Never   Sexual activity: Yes  Other Topics Concern   Not on file  Social History Narrative   Not on file   Social Determinants of Health   Financial Resource Strain: Not on file  Food Insecurity: Not on file  Transportation Needs: Not on file  Physical Activity: Not on file  Stress: Not on file  Social Connections: Not on file  Intimate Partner Violence: Not on file   Health Maintenance  Topic Date Due   Hepatitis C Screening  Never done   TETANUS/TDAP  Never done   COLONOSCOPY (Pts 45-71yrs Insurance coverage will need to be confirmed)  Never done   Zoster Vaccines- Shingrix (1 of 2) Never done   INFLUENZA VACCINE  09/28/2021 (Originally 01/29/2021)   COVID-19 Vaccine  Completed   HIV Screening  Completed   Pneumococcal Vaccine 36-42 Years old  Aged Out   HPV VACCINES  Aged Out    The following portions of the patient's history were reviewed and updated as appropriate: allergies, current medications, past family history, past medical history, past social history, past surgical history, and problem list.  Review of Systems Pertinent items noted in  HPI and remainder of comprehensive ROS otherwise negative.   Objective:    BP (!) 142/90 (BP Location: Left Arm, Patient Position: Sitting, Cuff Size: Large)    Pulse 75    Temp 98.9 F (37.2 C) (Oral)    Ht 6\' 1"  (1.854 m)    Wt 233 lb 6.4 oz (105.9 kg)    SpO2 97%    BMI 30.79 kg/m  General appearance: alert, cooperative, and no distress Head: Normocephalic, without obvious abnormality, atraumatic Eyes: conjunctivae/corneas clear. PERRL, EOM's intact. Fundi benign. Ears: Normal external ear, canal, and TM on left.  Normal external right ear, canal with cerumen partially obstructing view of TM. Nose: Nares normal. Septum midline. Mucosa normal. No drainage or sinus tenderness. Throat: lips, mucosa, and tongue normal; teeth and gums normal Neck: no adenopathy, no carotid bruit, no JVD, supple, symmetrical, trachea midline, and thyroid not enlarged, symmetric, no tenderness/mass/nodules Lungs: clear to auscultation bilaterally Heart: regular rate and rhythm, S1, S2 normal, no murmur, click, rub or gallop Abdomen: soft, non-tender; bowel sounds normal; no masses,  no organomegaly Extremities: extremities normal, atraumatic, no cyanosis or edema Pulses: 2+ and symmetric Skin: Skin color, texture, turgor normal. No rashes or lesions Lymph nodes: Cervical, supraclavicular, and axillary nodes normal. Neurologic: Alert and oriented X 3, normal strength and tone. Normal symmetric reflexes.  Normal coordination and gait    Assessment:    Healthy male exam with elevated blood pressure   Plan:    Anticipatory guidance given including wearing seatbelts, smoke detectors in the home, increasing physical activity, increasing p.o. intake of water and vegetables. -We will obtain labs -Discussed colonoscopy.  Referral placed. -Immunizations reviewed.  Patient declines influenza vaccine.  Will think about shingles and pneumonia vaccines. -Given handout -Neck CPE 1 year See After Visit Summary for  Counseling Recommendations   Essential hypertension -Elevated -We will increase Norvasc from 5 mg to 10 mg daily -Patient encouraged -Patient encouraged to take BP cuff for home monitoring -Follow-up in clinic in 1 month - Plan: Hemoglobin A1c, Lipid panel, CMP, amLODipine (NORVASC) 10 MG tablet  Colon cancer screening - Plan: Ambulatory referral to Gastroenterology  Screening for prostate cancer - Plan: PSA  Screening for cholesterol level - Plan: Lipid panel  Need for hepatitis C screening test - Plan: Hep C Antibody  Chronic anticoagulation -History of DVT and PE -Continue Xarelto 20 mg daily  Follow-up in 1 month for HTN  Abbe Amsterdam, MD

## 2021-06-14 LAB — HEPATITIS C ANTIBODY
Hepatitis C Ab: NONREACTIVE
SIGNAL TO CUT-OFF: 0.04 (ref <1.00)

## 2021-07-02 ENCOUNTER — Other Ambulatory Visit: Payer: Self-pay | Admitting: Family Medicine

## 2021-07-02 DIAGNOSIS — I2602 Saddle embolus of pulmonary artery with acute cor pulmonale: Secondary | ICD-10-CM

## 2021-07-18 ENCOUNTER — Ambulatory Visit: Payer: 59 | Admitting: Family Medicine

## 2021-07-18 ENCOUNTER — Encounter: Payer: Self-pay | Admitting: Family Medicine

## 2021-07-18 VITALS — BP 142/88 | HR 76 | Temp 99.1°F | Wt 234.4 lb

## 2021-07-18 DIAGNOSIS — I10 Essential (primary) hypertension: Secondary | ICD-10-CM | POA: Diagnosis not present

## 2021-07-18 NOTE — Progress Notes (Signed)
Subjective:    Patient ID: Christopher Coffey, male    DOB: [birth year], 56 y.o.   MRN: [id number]  Chief Complaint  Patient presents with   Follow-up    BP    HPI Patient was seen today for f/u.  Pt taking Norvasc 10 mg daily.  Has not been checking bp at home.  States occasionally walks for exercise.  Past Medical History:  Diagnosis Date   DVT (deep venous thrombosis) (HCC)    Hypertension    Pulmonary embolism (HCC)     No Known Allergies  ROS General: Denies fever, chills, night sweats, changes in weight, changes in appetite HEENT: Denies headaches, ear pain, changes in vision, rhinorrhea, sore throat CV: Denies CP, palpitations, SOB, orthopnea Pulm: Denies SOB, cough, wheezing GI: Denies abdominal pain, nausea, vomiting, diarrhea, constipation GU: Denies dysuria, hematuria, frequency Msk: Denies muscle cramps, joint pains Neuro: Denies weakness, numbness, tingling Skin: Denies rashes, bruising Psych: Denies depression, anxiety, hallucinations     Objective:    Blood pressure (!) 142/88, pulse 76, temperature 99.1 F (37.3 C), temperature source Oral, weight 234 lb 6.4 oz (106.3 kg), SpO2 96 %.  Gen. Pleasant, well-nourished, in no distress, normal affect   HEENT: [place]/AT, face symmetric, conjunctiva clear, no scleral icterus, PERRLA, EOMI, nares patent without drainage Lungs: no accessory muscle use, CTAB, no wheezes or rales Cardiovascular: RRR, no m/r/g, no peripheral edema Musculoskeletal: No deformities, no cyanosis or clubbing, normal tone Neuro:  A&Ox3, CN II-XII intact, normal gait Skin:  Warm, no lesions/ rash   Wt Readings from Last 3 Encounters:  07/18/21 234 lb 6.4 oz (106.3 kg)  06/13/21 233 lb 6.4 oz (105.9 kg)  03/04/18 227 lb 3.2 oz (103.1 kg)    Lab Results  Component Value Date   WBC 4.0 06/13/2021   HGB 16.5 06/13/2021   HCT 50.8 06/13/2021   PLT 173.0 06/13/2021   GLUCOSE 94 06/13/2021   CHOL 216 (H) 06/13/2021   TRIG 108.0  06/13/2021   HDL 51.30 06/13/2021   LDLCALC 143 (H) 06/13/2021   ALT 27 06/13/2021   AST 24 06/13/2021   NA 140 06/13/2021   K 4.4 06/13/2021   CL 104 06/13/2021   CREATININE 1.14 06/13/2021   BUN 14 06/13/2021   CO2 27 06/13/2021   TSH 2.31 06/13/2021   PSA 1.45 06/13/2021   HGBA1C 6.2 06/13/2021    Assessment/Plan:  Essential hypertension -uncontrolled -pt advised to check bp at home and bring a log to clinic -lifestyle modifications -continue Norvasc 10 mg daily -for continued elevation at next OFV, will start additional medication.  F/u in 1 month  Grier Mitts, MD

## 2021-07-29 ENCOUNTER — Other Ambulatory Visit: Payer: Self-pay | Admitting: Family Medicine

## 2021-07-29 DIAGNOSIS — I10 Essential (primary) hypertension: Secondary | ICD-10-CM

## 2021-08-14 ENCOUNTER — Other Ambulatory Visit: Payer: Self-pay | Admitting: Family Medicine

## 2021-08-14 DIAGNOSIS — I2602 Saddle embolus of pulmonary artery with acute cor pulmonale: Secondary | ICD-10-CM

## 2021-08-14 NOTE — Telephone Encounter (Signed)
From OV on 06/13/21: Chronic anticoagulation -History of DVT and PE -Continue Xarelto 20 mg daily

## 2021-08-29 ENCOUNTER — Encounter: Payer: Self-pay | Admitting: Family Medicine

## 2021-08-29 ENCOUNTER — Ambulatory Visit: Payer: 59 | Admitting: Family Medicine

## 2021-08-29 VITALS — BP 149/89 | HR 70 | Temp 98.6°F | Wt 229.2 lb

## 2021-08-29 DIAGNOSIS — I2602 Saddle embolus of pulmonary artery with acute cor pulmonale: Secondary | ICD-10-CM | POA: Diagnosis not present

## 2021-08-29 DIAGNOSIS — I10 Essential (primary) hypertension: Secondary | ICD-10-CM

## 2021-08-29 MED ORDER — AMLODIPINE BESYLATE 10 MG PO TABS: 10.0000 mg | ORAL_TABLET | Freq: Every day | ORAL | 3 refills | Status: DC

## 2021-08-29 MED ORDER — CHLORTHALIDONE 25 MG PO TABS: 12.5000 mg | ORAL_TABLET | Freq: Every day | ORAL | 3 refills | Status: DC

## 2021-08-29 MED ORDER — RIVAROXABAN 20 MG PO TABS: 20.0000 mg | ORAL_TABLET | Freq: Every day | ORAL | 3 refills | Status: DC

## 2021-08-29 NOTE — Progress Notes (Signed)
Subjective:  ? ? Patient ID: Christopher Coffey, male    DOB: [birth year], 56 y.o.   MRN: [id number] ? ?Chief Complaint  ?Patient presents with  ? Follow-up  ?  BP  ? ? ?HPI ?Patient was seen today for follow-up on blood pressure.  Patient taking Norvasc 10 mg daily.  Not checking bp at home.  Denies HAs, CP, changes in vision.  Eating fast food occasionally.  May have chips every few days.  Pt retiring in the next month which may help decrease his stress.  Pt inquires about 90-day refills insurance will be changing. ? ?Past Medical History:  ?Diagnosis Date  ? DVT (deep venous thrombosis) (HCC)   ? Hypertension   ? Pulmonary embolism (HCC)   ? ? ?No Known Allergies ? ?ROS ?General: Denies fever, chills, night sweats, changes in weight, changes in appetite ?HEENT: Denies headaches, ear pain, changes in vision, rhinorrhea, sore throat ?CV: Denies CP, palpitations, SOB, orthopnea ?Pulm: Denies SOB, cough, wheezing ?GI: Denies abdominal pain, nausea, vomiting, diarrhea, constipation ?GU: Denies dysuria, hematuria, frequency ?Msk: Denies muscle cramps, joint pains ?Neuro: Denies weakness, numbness, tingling ?Skin: Denies rashes, bruising ?Psych: Denies depression, anxiety, hallucinations ? ?   ?Objective:  ?  ?Blood pressure (!) 142/90, pulse 70, temperature 98.6 ?F (37 ?C), temperature source Oral, weight 229 lb 3.2 oz (104 kg), SpO2 99 %. ? ?Gen. Pleasant, well-nourished, in no distress, normal affect   ?HEENT: [place]/AT, face symmetric, conjunctiva clear, no scleral icterus, PERRLA, EOMI, nares patent without drainage ?Lungs: no accessory muscle use, CTAB, no wheezes or rales ?Cardiovascular: RRR, no m/r/g, no peripheral edema ?Musculoskeletal: No deformities, no cyanosis or clubbing, normal tone ?Neuro:  A&Ox3, CN II-XII intact, normal gait ?Skin:  Warm, no lesions/ rash ? ? ?Wt Readings from Last 3 Encounters:  ?08/29/21 229 lb 3.2 oz (104 kg)  ?07/18/21 234 lb 6.4 oz (106.3 kg)  ?06/13/21 233 lb 6.4 oz (105.9 kg)   ? ? ?Lab Results  ?Component Value Date  ? WBC 4.0 06/13/2021  ? HGB 16.5 06/13/2021  ? HCT 50.8 06/13/2021  ? PLT 173.0 06/13/2021  ? GLUCOSE 94 06/13/2021  ? CHOL 216 (H) 06/13/2021  ? TRIG 108.0 06/13/2021  ? HDL 51.30 06/13/2021  ? LDLCALC 143 (H) 06/13/2021  ? ALT 27 06/13/2021  ? AST 24 06/13/2021  ? NA 140 06/13/2021  ? K 4.4 06/13/2021  ? CL 104 06/13/2021  ? CREATININE 1.14 06/13/2021  ? BUN 14 06/13/2021  ? CO2 27 06/13/2021  ? TSH 2.31 06/13/2021  ? PSA 1.45 06/13/2021  ? HGBA1C 6.2 06/13/2021  ? ? ?Assessment/Plan: ? ?Essential hypertension  ?-Uncontrolled ?-Recheck BP 149/89 ?-Discussed importance of lifestyle modifications including exercise, reading labels, increasing intake of water, etc. ?-Will start chlorthalidone 12.5 mg daily ?-Continue Norvasc 10 mg daily ?- Plan: chlorthalidone (HYGROTON) 25 MG tablet, amLODipine (NORVASC) 10 MG tablet ? ?Acute saddle pulmonary embolism with acute cor pulmonale (HCC) ?-h/o PE on chronic anticoagulation for RLE DVT and saddle PE, 01/08/18 ?-Evaluated by Heme-onc.  As consider unprovoked clot lifelong anticoagulation recommended after heme-onc ? - Plan: rivaroxaban (XARELTO) 20 MG TABS tablet ? ?F/u in 2-3 months ? ?Abbe Amsterdam, MD ?

## 2021-11-08 ENCOUNTER — Encounter: Payer: Self-pay | Admitting: Family Medicine

## 2021-11-21 ENCOUNTER — Encounter: Payer: Self-pay | Admitting: Family Medicine

## 2021-11-21 ENCOUNTER — Ambulatory Visit: Payer: Commercial Managed Care - HMO | Admitting: Family Medicine

## 2021-11-21 VITALS — BP 142/88 | HR 66 | Temp 98.3°F | Wt 226.8 lb

## 2021-11-21 DIAGNOSIS — I10 Essential (primary) hypertension: Secondary | ICD-10-CM | POA: Diagnosis not present

## 2021-11-21 DIAGNOSIS — R0683 Snoring: Secondary | ICD-10-CM

## 2021-11-21 MED ORDER — CHLORTHALIDONE 25 MG PO TABS: 25.0000 mg | ORAL_TABLET | Freq: Every day | ORAL | 1 refills | Status: DC

## 2021-11-21 NOTE — Patient Instructions (Addendum)
A referral was placed for sleep study.  You should receive a phone call about scheduling this appointment.  I have included information about sleep apnea for you to review.  Visit we discussed increasing chlorthalidone from 12.5 to 25 mg daily.  Is important that you make sure you are drinking plenty of water throughout the day as you may notice increased urination.  For some people chlorthalidone causes decreased potassium.  If this is the case she may need to take a potassium supplement along with the daily pill.  We will check your potassium at next office visit.

## 2021-11-21 NOTE — Progress Notes (Signed)
Subjective:    Patient ID: Christopher Coffey, male    DOB: 10-23-1965, 56 y.o.   MRN: [id number]  Chief Complaint  Patient presents with   Follow-up    BP    HPI Patient was seen today for HTN.  Pt denies issues since starting chlorthalidone 12.5 mg along with Norvasc 10 mg daily.  Patient denies headaches, dizziness, CP, changes in vision, LE edema.  Patient's not been checking BP.  States eats regular foods.  Told by his wife he snores at night.  Patient denies waking up gasping for air.   Past Medical History:  Diagnosis Date   DVT (deep venous thrombosis) (HCC)    Hypertension    Pulmonary embolism (HCC)     No Known Allergies  ROS General: Denies fever, chills, night sweats, changes in weight, changes in appetite  +snoring HEENT: Denies headaches, ear pain, changes in vision, rhinorrhea, sore throat. CV: Denies CP, palpitations, SOB, orthopnea Pulm: Denies SOB, cough, wheezing GI: Denies abdominal pain, nausea, vomiting, diarrhea, constipation GU: Denies dysuria, hematuria, frequency Msk: Denies muscle cramps, joint pains Neuro: Denies weakness, numbness, tingling Skin: Denies rashes, bruising Psych: Denies depression, anxiety, hallucinations   Objective:    Blood pressure (!) 142/94, pulse 66, temperature 98.3 F (36.8 C), temperature source Oral, weight 226 lb 12.8 oz (102.9 kg), SpO2 96 %.  Gen. Pleasant, well-nourished, in no distress, normal affect   HEENT: [place]/AT, face symmetric, conjunctiva clear, no scleral icterus, PERRLA, EOMI, nares patent without drainage Lungs: no accessory muscle use Cardiovascular: RRR, no peripheral edema Musculoskeletal: No deformities, no cyanosis or clubbing, normal tone Neuro:  A&Ox3, CN II-XII intact, normal gait Skin:  Warm, no lesions/ rash  Wt Readings from Last 3 Encounters:  11/21/21 226 lb 12.8 oz (102.9 kg)  08/29/21 229 lb 3.2 oz (104 kg)  07/18/21 234 lb 6.4 oz (106.3 kg)    Lab Results  Component Value Date    WBC 4.0 06/13/2021   HGB 16.5 06/13/2021   HCT 50.8 06/13/2021   PLT 173.0 06/13/2021   GLUCOSE 94 06/13/2021   CHOL 216 (H) 06/13/2021   TRIG 108.0 06/13/2021   HDL 51.30 06/13/2021   LDLCALC 143 (H) 06/13/2021   ALT 27 06/13/2021   AST 24 06/13/2021   NA 140 06/13/2021   K 4.4 06/13/2021   CL 104 06/13/2021   CREATININE 1.14 06/13/2021   BUN 14 06/13/2021   CO2 27 06/13/2021   TSH 2.31 06/13/2021   PSA 1.45 06/13/2021   HGBA1C 6.2 06/13/2021    Assessment/Plan:  Essential hypertension  -elevated -will increase chlorthalidone from 12.5 mg to 25 mg daily. -continue norvasc 10 mg daily -lifestyle modifications strongly encouraged -pt also advised to check bp more frequently. - Plan: Ambulatory referral to Sleep Studies, chlorthalidone (HYGROTON) 25 MG tablet  Snoring  -discussed OSA may be contributing to elevated bp  - Plan: Ambulatory referral to Sleep Studies  F/u in 1-2 month  Abbe Amsterdam, MD

## 2021-12-19 ENCOUNTER — Telehealth: Payer: Self-pay | Admitting: Family Medicine

## 2021-12-19 ENCOUNTER — Telehealth: Payer: Self-pay

## 2021-12-19 NOTE — Telephone Encounter (Signed)
PA started for Xarelto.  Key Retta Mac

## 2021-12-19 NOTE — Telephone Encounter (Signed)
PT needs a prior authorization for a Rx  aroxaban (XARELTO) 20 MG TABS tablet  Pt is completely out of the medication. Pt is stating it is an URGENT request!!  [facility][id number] [place], [place] - [address] AT [facility] [address] [facility] [address] Phone:  [phone]  Fax:  [phone]

## 2021-12-19 NOTE — Telephone Encounter (Signed)
PA was done this morning. Waiting on response.

## 2022-02-22 ENCOUNTER — Other Ambulatory Visit: Payer: Self-pay

## 2022-02-22 DIAGNOSIS — I2602 Saddle embolus of pulmonary artery with acute cor pulmonale: Secondary | ICD-10-CM

## 2022-02-22 MED ORDER — RIVAROXABAN 20 MG PO TABS: 20.0000 mg | ORAL_TABLET | Freq: Every day | ORAL | 3 refills | Status: DC

## 2022-03-03 ENCOUNTER — Other Ambulatory Visit: Payer: Self-pay | Admitting: Family Medicine

## 2022-03-03 DIAGNOSIS — I10 Essential (primary) hypertension: Secondary | ICD-10-CM

## 2022-08-30 ENCOUNTER — Other Ambulatory Visit: Payer: Self-pay | Admitting: Family Medicine

## 2022-08-30 DIAGNOSIS — I10 Essential (primary) hypertension: Secondary | ICD-10-CM

## 2022-08-30 DIAGNOSIS — I2602 Saddle embolus of pulmonary artery with acute cor pulmonale: Secondary | ICD-10-CM

## 2022-11-07 ENCOUNTER — Other Ambulatory Visit: Payer: Self-pay | Admitting: Family Medicine

## 2022-11-07 DIAGNOSIS — I2602 Saddle embolus of pulmonary artery with acute cor pulmonale: Secondary | ICD-10-CM

## 2022-11-23 ENCOUNTER — Other Ambulatory Visit: Payer: Self-pay | Admitting: Family Medicine

## 2022-11-23 DIAGNOSIS — I10 Essential (primary) hypertension: Secondary | ICD-10-CM

## 2022-12-03 ENCOUNTER — Other Ambulatory Visit: Payer: Self-pay | Admitting: Family Medicine

## 2022-12-03 DIAGNOSIS — I10 Essential (primary) hypertension: Secondary | ICD-10-CM

## 2022-12-05 ENCOUNTER — Telehealth: Payer: Self-pay | Admitting: Family Medicine

## 2022-12-05 DIAGNOSIS — I10 Essential (primary) hypertension: Secondary | ICD-10-CM

## 2022-12-05 NOTE — Telephone Encounter (Signed)
Pt has an appt on Monday 12-09-2022 and would like a refill on amLODipine (NORVASC) 10 MG tablet , chlorthalidone (HYGROTON) 25 MG tablet  [facility] #[id number] - [place], [place] - [address] AT [facility] [address] [facility] [address] Phone: [phone]  Fax: [phone]

## 2022-12-06 ENCOUNTER — Other Ambulatory Visit: Payer: Self-pay | Admitting: Family Medicine

## 2022-12-06 DIAGNOSIS — I10 Essential (primary) hypertension: Secondary | ICD-10-CM

## 2022-12-06 MED ORDER — CHLORTHALIDONE 25 MG PO TABS: 25.0000 mg | ORAL_TABLET | Freq: Every day | ORAL | 0 refills | Status: DC

## 2022-12-06 MED ORDER — AMLODIPINE BESYLATE 10 MG PO TABS: ORAL_TABLET | ORAL | 0 refills | Status: DC

## 2022-12-06 NOTE — Telephone Encounter (Signed)
30 day supply sent to pharmacy for blood pressure medications.

## 2022-12-06 NOTE — Telephone Encounter (Signed)
Okay to provide a limited 30-day refill on requested BP meds until patient's upcoming appointment.

## 2022-12-09 ENCOUNTER — Encounter: Payer: Self-pay | Admitting: Family Medicine

## 2022-12-09 ENCOUNTER — Ambulatory Visit: Payer: Commercial Managed Care - HMO | Admitting: Family Medicine

## 2022-12-09 VITALS — BP 139/86 | HR 90 | Temp 98.1°F | Ht 73.0 in | Wt 229.2 lb

## 2022-12-09 DIAGNOSIS — I10 Essential (primary) hypertension: Secondary | ICD-10-CM | POA: Diagnosis not present

## 2022-12-09 DIAGNOSIS — Z86711 Personal history of pulmonary embolism: Secondary | ICD-10-CM

## 2022-12-09 DIAGNOSIS — I2602 Saddle embolus of pulmonary artery with acute cor pulmonale: Secondary | ICD-10-CM | POA: Diagnosis not present

## 2022-12-09 LAB — T4, FREE: Free T4: 0.8 ng/dL (ref 0.60–1.60)

## 2022-12-09 LAB — COMPREHENSIVE METABOLIC PANEL
ALT: 26 U/L (ref 0–53)
AST: 23 U/L (ref 0–37)
Albumin: 4.1 g/dL (ref 3.5–5.2)
Alkaline Phosphatase: 51 U/L (ref 39–117)
BUN: 16 mg/dL (ref 6–23)
CO2: 25 mEq/L (ref 19–32)
Calcium: 9.3 mg/dL (ref 8.4–10.5)
Chloride: 104 mEq/L (ref 96–112)
Creatinine, Ser: 1.22 mg/dL (ref 0.40–1.50)
GFR: 65.95 mL/min (ref >60.00)
Glucose, Bld: 107 mg/dL — ABNORMAL HIGH (ref 70–99)
Potassium: 3.7 mEq/L (ref 3.5–5.1)
Sodium: 139 mEq/L (ref 135–145)
Total Bilirubin: 0.7 mg/dL (ref 0.2–1.2)
Total Protein: 7.6 g/dL (ref 6.0–8.3)

## 2022-12-09 LAB — TSH: TSH: 2.16 u[IU]/mL (ref 0.35–5.50)

## 2022-12-09 MED ORDER — RIVAROXABAN 20 MG PO TABS: ORAL_TABLET | ORAL | 3 refills | Status: DC

## 2022-12-09 MED ORDER — CHLORTHALIDONE 25 MG PO TABS: 25.0000 mg | ORAL_TABLET | Freq: Every day | ORAL | 1 refills | Status: DC

## 2022-12-09 MED ORDER — AMLODIPINE BESYLATE 10 MG PO TABS: ORAL_TABLET | ORAL | 1 refills | Status: DC

## 2022-12-09 NOTE — Progress Notes (Signed)
Established Patient Office Visit   Subjective  Patient ID: Christopher Coffey, male    DOB: 1965/08/11  Age: 57 y.o. MRN: [id number]  Chief Complaint  Patient presents with   Medical Management of Chronic Issues    Follow up BP.    Pt is a 57 yo males seen for f/u on HTN.  Taking Norvasc 10 mg and HCTZ 25 mg daily. Out of meds x 1 wk.  Both meds were refilled however patient states pharmacy only notified him about Norvasc.  Not checking bp at home.  States he does not feel bad.  Had burrito last night for dinner. Walking for exercise a few times per wk.  Denies HAs, CP, changes in vision, LE edema.    Past Medical History:  Diagnosis Date   DVT (deep venous thrombosis) (HCC)    Hypertension    Pulmonary embolism (HCC)    No past surgical history on file. Social History   Tobacco Use   Smoking status: Never   Smokeless tobacco: Never  Vaping Use   Vaping Use: Never used  Substance Use Topics   Alcohol use: Yes    Comment: Social Drinker-Rarely   Drug use: Never   Family History  Problem Relation Age of Onset   Hypertension Mother    Deep vein thrombosis Brother    No Known Allergies    ROS Negative unless stated above    Objective:     BP (!) 142/86 (BP Location: Right Arm, Patient Position: Sitting, Cuff Size: Large)   Pulse 90   Temp 98.1 F (36.7 C) (Oral)   Ht 6\' 1"  (1.854 m)   Wt 229 lb 3.2 oz (104 kg)   SpO2 98%   BMI 30.24 kg/m  BP Readings from Last 3 Encounters:  12/09/22 139/86  11/21/21 (!) 142/88  08/29/21 (!) 149/89   Wt Readings from Last 3 Encounters:  12/09/22 229 lb 3.2 oz (104 kg)  11/21/21 226 lb 12.8 oz (102.9 kg)  08/29/21 229 lb 3.2 oz (104 kg)      Physical Exam Constitutional:      General: He is not in acute distress.    Appearance: Normal appearance.  HENT:     Head: Normocephalic and atraumatic.     Nose: Nose normal.     Mouth/Throat:     Mouth: Mucous membranes are moist.  Cardiovascular:     Rate and  Rhythm: Normal rate and regular rhythm.     Heart sounds: Normal heart sounds. No murmur heard.    No gallop.  Pulmonary:     Effort: Pulmonary effort is normal. No respiratory distress.     Breath sounds: Normal breath sounds. No wheezing, rhonchi or rales.  Skin:    General: Skin is warm and dry.  Neurological:     Mental Status: He is alert and oriented to person, place, and time.      No results found for any visits on 12/09/22.    Assessment & Plan:  Essential hypertension -     amLODIPine Besylate; TAKE 1 TABLET(10 MG) BY MOUTH DAILY  Dispense: 90 tablet; Refill: 1 -     Chlorthalidone; Take 1 tablet (25 mg total) by mouth daily.  Dispense: 90 tablet; Refill: 1 -     Comprehensive metabolic panel -     TSH -     T4, free  History of pulmonary embolus (PE) -     Rivaroxaban; TAKE 1 TABLET(20 MG) BY MOUTH DAILY WITH  SUPPER  Dispense: 90 tablet; Refill: 3  Acute saddle pulmonary embolism with acute cor pulmonale (HCC) -     Rivaroxaban; TAKE 1 TABLET(20 MG) BY MOUTH DAILY WITH SUPPER  Dispense: 90 tablet; Refill: 3   BP elevated.  Recheck.  Patient encouraged to decrease sodium intake and other lifestyle modifications.  Advised to check BP at home and keep a log to bring with him to clinic.  Will obtain labs as last done 2022.  Rivaroxaban refilled for history of saddle PE.   Colon cancer screening encouraged.  Patient declines colonoscopy or Cologuard.  Return in about 6 months (around 06/10/2023) for blood pressure re-check.   Deeann Saint, MD

## 2023-03-19 ENCOUNTER — Other Ambulatory Visit: Payer: Self-pay

## 2023-03-19 DIAGNOSIS — Z86711 Personal history of pulmonary embolism: Secondary | ICD-10-CM

## 2023-03-19 DIAGNOSIS — I2602 Saddle embolus of pulmonary artery with acute cor pulmonale: Secondary | ICD-10-CM

## 2023-03-19 MED ORDER — RIVAROXABAN 20 MG PO TABS
ORAL_TABLET | ORAL | 0 refills | Status: DC
Start: 2023-03-19 — End: 2023-03-27

## 2023-03-27 NOTE — Telephone Encounter (Signed)
Pt is calling checking on the status of rivaroxaban (XARELTO) 20 MG TABS tablet need to go to  [facility] [address] [place], [place] - [address] Phone: [phone]  Fax: [phone]

## 2023-03-27 NOTE — Addendum Note (Signed)
Addended by: Philipp Deputy A on: 03/27/2023 01:19 PM   Modules accepted: Orders

## 2023-07-07 ENCOUNTER — Other Ambulatory Visit: Payer: Self-pay | Admitting: Family Medicine

## 2023-07-07 DIAGNOSIS — I10 Essential (primary) hypertension: Secondary | ICD-10-CM

## 2023-07-08 ENCOUNTER — Telehealth: Payer: Self-pay

## 2023-07-08 NOTE — Telephone Encounter (Signed)
 Called patient to sch OV for BP recheck per last OV

## 2023-07-21 ENCOUNTER — Ambulatory Visit: Payer: Commercial Managed Care - HMO | Admitting: Family Medicine

## 2023-07-21 ENCOUNTER — Encounter: Payer: Self-pay | Admitting: Family Medicine

## 2023-07-21 VITALS — BP 142/78 | HR 69 | Temp 98.3°F | Ht 73.0 in | Wt 231.4 lb

## 2023-07-21 DIAGNOSIS — I10 Essential (primary) hypertension: Secondary | ICD-10-CM | POA: Diagnosis not present

## 2023-07-21 MED ORDER — OLMESARTAN MEDOXOMIL 5 MG PO TABS
5.0000 mg | ORAL_TABLET | Freq: Every day | ORAL | 1 refills | Status: DC
Start: 2023-07-21 — End: 2024-01-14

## 2023-07-21 NOTE — Patient Instructions (Signed)
Continue taking chlorthalidone 25 mg daily and Norvasc 10 mg daily.  A new prescription for olmesartan 5 mg was sent to your pharmacy.  This is another blood pressure medication to take with the 2 you are already on.  When you are in need of a refill of the prescription for Norvasc notify clinic so we can send in a prescription for combination pill with the Norvasc and olmesartan in the 1 pill.

## 2023-07-21 NOTE — Progress Notes (Signed)
Established Patient Office Visit   Subjective  Patient ID: Christopher Coffey, male    DOB: [birth year]  Age: 58 y.o. MRN: [id number]  Chief Complaint  Patient presents with   Hypertension    Bp check     Patient is a 58 year old male seen for follow-up on HTN.  Patient states he is taking Norvasc 10 mg daily and chlorthalidone 25 mg daily.  Not checking BP at home.  States without of Norvasc for about a week but has been back on medication for about 5 days.  Patient states his wife tells him he snores.  Patient denies waking up gasping, feeling unrested in the morning, or having to take naps during the day.  Does not recall having a sleep study.    Patient Active Problem List   Diagnosis Date Noted   Gout 02/05/2018   DVT (deep vein thrombosis) in pregnancy    Hypoxemia    Pulmonary HTN (HCC)    Deep vein thrombosis (DVT) of proximal lower extremity (HCC)    Acute saddle pulmonary embolism (HCC) 01/09/2018   Acute saddle pulmonary embolism with acute cor pulmonale (HCC)    Past Medical History:  Diagnosis Date   DVT (deep venous thrombosis) (HCC)    Hypertension    Pulmonary embolism (HCC)    History reviewed. No pertinent surgical history. Social History   Tobacco Use   Smoking status: Never   Smokeless tobacco: Never  Vaping Use   Vaping status: Never Used  Substance Use Topics   Alcohol use: Yes    Comment: Social Drinker-Rarely   Drug use: Never   Family History  Problem Relation Age of Onset   Hypertension Mother    Deep vein thrombosis Brother    No Known Allergies    ROS Negative unless stated above    Objective:     BP (!) 142/80 (BP Location: Right Arm, Patient Position: Sitting, Cuff Size: Large)   Pulse 69   Temp 98.3 F (36.8 C) (Oral)   Ht 6\' 1"  (1.854 m)   Wt 231 lb 6.4 oz (105 kg)   SpO2 98%   BMI 30.53 kg/m  BP Readings from Last 3 Encounters:  07/21/23 (!) 142/80  12/09/22 139/86  11/21/21 (!) 142/88   Wt Readings from Last  3 Encounters:  07/21/23 231 lb 6.4 oz (105 kg)  12/09/22 229 lb 3.2 oz (104 kg)  11/21/21 226 lb 12.8 oz (102.9 kg)     Physical Exam Constitutional:      General: He is not in acute distress.    Appearance: Normal appearance.  HENT:     Head: Normocephalic and atraumatic.     Nose: Nose normal.     Mouth/Throat:     Mouth: Mucous membranes are moist.  Cardiovascular:     Rate and Rhythm: Normal rate and regular rhythm.     Heart sounds: Normal heart sounds. No murmur heard.    No gallop.  Pulmonary:     Effort: Pulmonary effort is normal. No respiratory distress.     Breath sounds: Normal breath sounds. No wheezing, rhonchi or rales.  Skin:    General: Skin is warm and dry.  Neurological:     Mental Status: He is alert and oriented to person, place, and time.     No results found for any visits on 07/21/23.    Assessment & Plan:  Essential hypertension -     Olmesartan Medoxomil; Take 1 tablet (5 mg total) by  mouth daily.  Dispense: 90 tablet; Refill: 1  Given can continued BP elevation discussed the need for additional medication for better control.  Patient will continue Norvasc 10 mg daily and chlorthalidone 25 mg daily.  Will start olmesartan 5 mg daily.  Lifestyle modification strongly encouraged.  Patient also encouraged to obtain a BP cuff and monitor his blood pressure at home and bring a log with him to clinic.  Also consider sleep study.  As starting new medication, patient wishes to wait on Tdap and shingles vaccines.  Return in about 6 weeks (around 09/01/2023) for blood pressure.   Deeann Saint, MD

## 2023-08-06 ENCOUNTER — Other Ambulatory Visit (HOSPITAL_COMMUNITY): Payer: Self-pay

## 2023-08-06 ENCOUNTER — Telehealth: Payer: Self-pay

## 2023-08-06 NOTE — Telephone Encounter (Signed)
 Pharmacy Patient Advocate Encounter   Received notification from Patient Pharmacy that prior authorization for Xarelto  20mg  is required/requested.   Insurance verification completed.   The patient is insured through [employer] [facility] .   Per test claim: Refill too soon. PA is not needed at this time. Medication was filled 08/02/23. Next eligible fill date is 08/25/23.

## 2023-08-07 ENCOUNTER — Other Ambulatory Visit (HOSPITAL_COMMUNITY): Payer: Self-pay

## 2023-08-25 ENCOUNTER — Other Ambulatory Visit: Payer: Self-pay | Admitting: Family Medicine

## 2023-08-25 DIAGNOSIS — I10 Essential (primary) hypertension: Secondary | ICD-10-CM

## 2023-08-26 ENCOUNTER — Other Ambulatory Visit: Payer: Self-pay | Admitting: Family Medicine

## 2023-08-26 DIAGNOSIS — I10 Essential (primary) hypertension: Secondary | ICD-10-CM

## 2023-09-24 ENCOUNTER — Other Ambulatory Visit: Payer: Self-pay | Admitting: Family Medicine

## 2023-09-24 DIAGNOSIS — I10 Essential (primary) hypertension: Secondary | ICD-10-CM

## 2023-10-03 ENCOUNTER — Other Ambulatory Visit: Payer: Self-pay | Admitting: Family Medicine

## 2023-10-03 DIAGNOSIS — I10 Essential (primary) hypertension: Secondary | ICD-10-CM

## 2023-10-03 MED ORDER — AMLODIPINE BESYLATE 10 MG PO TABS: ORAL_TABLET | ORAL | 0 refills | Status: DC

## 2023-10-03 NOTE — Telephone Encounter (Signed)
 Copied from CRM [id number]. Topic: Clinical - Medication Refill >> Oct 03, 2023 10:08 AM Drema Balzarine wrote: Most Recent Primary Care Visit:  Provider: Deeann Saint  Department: [facility]  Visit Type: OFFICE VISIT  Date: 07/21/2023  Medication: amLODipine   Has the patient contacted their pharmacy? Yes (Agent: If no, request that the patient contact the pharmacy for the refill. If patient does not wish to contact the pharmacy document the reason why and proceed with request.) (Agent: If yes, when and what did the pharmacy advise?)  Is this the correct pharmacy for this prescription? Yes If no, delete pharmacy and type the correct one.  This is the patient's preferred pharmacy:   [facility] #[id number] [place], [place] - [address] AT [facility] [address] & [facility] [address] [place] [address] Phone: [phone] Fax: [phone]   Has the prescription been filled recently? Yes  Is the patient out of the medication? No, a few days left   Has the patient been seen for an appointment in the last year OR does the patient have an upcoming appointment? Yes  Can we respond through MyChart? No  Agent: Please be advised that Rx refills may take up to 3 business days. We ask that you follow-up with your pharmacy.

## 2023-10-06 ENCOUNTER — Other Ambulatory Visit: Payer: Self-pay | Admitting: Family Medicine

## 2023-10-06 DIAGNOSIS — I10 Essential (primary) hypertension: Secondary | ICD-10-CM

## 2023-10-24 ENCOUNTER — Other Ambulatory Visit: Payer: Self-pay | Admitting: Family Medicine

## 2023-10-24 DIAGNOSIS — I10 Essential (primary) hypertension: Secondary | ICD-10-CM

## 2023-11-18 ENCOUNTER — Other Ambulatory Visit: Payer: Self-pay | Admitting: Family Medicine

## 2023-11-18 DIAGNOSIS — I10 Essential (primary) hypertension: Secondary | ICD-10-CM

## 2023-11-19 ENCOUNTER — Other Ambulatory Visit: Payer: Self-pay | Admitting: Family Medicine

## 2023-11-19 DIAGNOSIS — I10 Essential (primary) hypertension: Secondary | ICD-10-CM

## 2023-11-19 MED ORDER — CHLORTHALIDONE 25 MG PO TABS: 25.0000 mg | ORAL_TABLET | Freq: Every day | ORAL | 1 refills | Status: DC

## 2024-01-13 ENCOUNTER — Other Ambulatory Visit: Payer: Self-pay | Admitting: Family Medicine

## 2024-01-13 DIAGNOSIS — I10 Essential (primary) hypertension: Secondary | ICD-10-CM

## 2024-01-14 ENCOUNTER — Telehealth: Payer: Self-pay

## 2024-01-14 DIAGNOSIS — I10 Essential (primary) hypertension: Secondary | ICD-10-CM

## 2024-01-14 MED ORDER — OLMESARTAN MEDOXOMIL 5 MG PO TABS: 5.0000 mg | ORAL_TABLET | Freq: Every day | ORAL | 0 refills | Status: DC

## 2024-01-14 MED ORDER — AMLODIPINE BESYLATE 10 MG PO TABS: ORAL_TABLET | ORAL | 0 refills | Status: DC

## 2024-01-14 NOTE — Telephone Encounter (Signed)
 Copied from CRM [id number]. Topic: Clinical - Prescription Issue >> Jan 14, 2024 10:02 AM Christopher Coffey wrote: Reason for CRM: Patient said that he is completely out of Amlodipine  and have a few days left of Olmesartan . He wants to know if he can get a refill on it before his appointment.

## 2024-01-14 NOTE — Addendum Note (Signed)
 Addended by: BRIEN SONG A on: 01/14/2024 12:00 PM   Modules accepted: Orders

## 2024-01-14 NOTE — Telephone Encounter (Signed)
 Medication has been sent to pharmacy.

## 2024-01-19 ENCOUNTER — Ambulatory Visit (INDEPENDENT_AMBULATORY_CARE_PROVIDER_SITE_OTHER): Payer: Self-pay | Admitting: Family Medicine

## 2024-01-19 VITALS — BP 116/86 | HR 72 | Temp 98.4°F | Ht 73.0 in | Wt 231.6 lb

## 2024-01-19 DIAGNOSIS — Z86711 Personal history of pulmonary embolism: Secondary | ICD-10-CM | POA: Diagnosis not present

## 2024-01-19 DIAGNOSIS — Z7901 Long term (current) use of anticoagulants: Secondary | ICD-10-CM | POA: Diagnosis not present

## 2024-01-19 DIAGNOSIS — I10 Essential (primary) hypertension: Secondary | ICD-10-CM | POA: Diagnosis not present

## 2024-01-19 MED ORDER — CHLORTHALIDONE 25 MG PO TABS: 25.0000 mg | ORAL_TABLET | Freq: Every day | ORAL | 3 refills | Status: DC

## 2024-01-19 MED ORDER — AMLODIPINE BESYLATE 10 MG PO TABS: ORAL_TABLET | ORAL | 3 refills | Status: AC

## 2024-01-19 MED ORDER — RIVAROXABAN 20 MG PO TABS: 20.0000 mg | ORAL_TABLET | Freq: Every day | ORAL | 3 refills | Status: DC

## 2024-01-19 MED ORDER — OLMESARTAN MEDOXOMIL 5 MG PO TABS: 5.0000 mg | ORAL_TABLET | Freq: Every day | ORAL | 3 refills | Status: AC

## 2024-01-19 NOTE — Patient Instructions (Signed)
 CPE due on or after 06/13/2024.

## 2024-01-19 NOTE — Progress Notes (Signed)
 Established Patient Office Visit   Subjective  Patient ID: Christopher Coffey, male    DOB: 1965/10/21  Age: 58 y.o. MRN: [id number]  Chief Complaint  Patient presents with   Medical Management of Chronic Issues    Following up on BP.      Patient is a 58 year old male seen for follow-up on HTN.  At last visit patient started on olmesartan  5 mg along with Chlorthalidone  25 mg and Norvasc  10 mg daily.  Patient is not checking BP.  Denies headaches, CP, changes in vision, LE edema.  Walking to help with stress.  Requesting refill on Xarelto .  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  Previously seen by oncology.  Asking that prescriptions be ready for refill around the same time for convenience working in [place] during the week and home on weekends.    Patient Active Problem List   Diagnosis Date Noted   Gout 02/05/2018   DVT (deep vein thrombosis) in pregnancy    Hypoxemia    Pulmonary HTN (HCC)    Deep vein thrombosis (DVT) of proximal lower extremity (HCC)    Acute saddle pulmonary embolism (HCC) 01/09/2018   Acute saddle pulmonary embolism with acute cor pulmonale (HCC)    Past Medical History:  Diagnosis Date   DVT (deep venous thrombosis) (HCC)    Hypertension    Pulmonary embolism (HCC)    No past surgical history on file. Social History   Tobacco Use   Smoking status: Never   Smokeless tobacco: Never  Vaping Use   Vaping status: Never Used  Substance Use Topics   Alcohol use: Yes    Comment: Social Drinker-Rarely   Drug use: Never   Family History  Problem Relation Age of Onset   Hypertension Mother    Deep vein thrombosis Brother    No Known Allergies  ROS Negative unless stated above    Objective:     BP 116/86 (BP Location: Left Arm, Patient Position: Sitting, Cuff Size: Large)   Pulse 72   Temp 98.4 F (36.9 C) (Oral)   Ht 6' 1 (1.854 m)   Wt 231 lb 9.6 oz (105.1 kg)   SpO2 97%   BMI 30.56 kg/m  BP  Readings from Last 3 Encounters:  01/19/24 116/86  07/21/23 (!) 142/78  12/09/22 139/86   Wt Readings from Last 3 Encounters:  01/19/24 231 lb 9.6 oz (105.1 kg)  07/21/23 231 lb 6.4 oz (105 kg)  12/09/22 229 lb 3.2 oz (104 kg)      Physical Exam Constitutional:      General: He is not in acute distress.    Appearance: Normal appearance.  HENT:     Head: Normocephalic and atraumatic.     Nose: Nose normal.     Mouth/Throat:     Mouth: Mucous membranes are moist.  Cardiovascular:     Rate and Rhythm: Normal rate and regular rhythm.     Heart sounds: Normal heart sounds. No murmur heard.    No gallop.  Pulmonary:     Effort: Pulmonary effort is normal. No respiratory distress.     Breath sounds: Normal breath sounds. No wheezing, rhonchi or rales.  Skin:    General: Skin is warm and dry.  Neurological:     Mental Status: He is alert and oriented to person, place, and time.        07/21/2023    8:20 AM 12/09/2022    9:06 AM 11/21/2021  8:31 AM  Depression screen PHQ 2/9  Decreased Interest 0 0 0  Down, Depressed, Hopeless 0 0 0  PHQ - 2 Score 0 0 0  Altered sleeping 0  0  Tired, decreased energy 0  0  Change in appetite 0  0  Feeling bad or failure about yourself  0  0  Trouble concentrating 0  0  Moving slowly or fidgety/restless 0  0  Suicidal thoughts 0  0  PHQ-9 Score 0  0  Difficult doing work/chores Not difficult at all        07/21/2023    8:20 AM 12/09/2022    9:06 AM  GAD 7 : Generalized Anxiety Score  Nervous, Anxious, on Edge 0 0  Control/stop worrying 0 0  Worry too much - different things 0 0  Trouble relaxing 0 0  Restless 0 0  Easily annoyed or irritable 0 0  Afraid - awful might happen 0 0  Total GAD 7 Score 0 0  Anxiety Difficulty Not difficult at all      No results found for any visits on 01/19/24.    Assessment & Plan:   Essential hypertension -     amLODIPine  Besylate; TAKE 1 TABLET(10 MG) BY MOUTH DAILY  Dispense: 90 tablet;  Refill: 3 -     Chlorthalidone ; Take 1 tablet (25 mg total) by mouth daily.  Dispense: 90 tablet; Refill: 3 -     Olmesartan  Medoxomil; Take 1 tablet (5 mg total) by mouth daily.  Dispense: 90 tablet; Refill: 3  History of pulmonary embolism -     Rivaroxaban ; Take 1 tablet (20 mg total) by mouth daily with supper.  Dispense: 90 tablet; Refill: 3  Chronic anticoagulation   BP now well-controlled on current regimen.  Continue Norvasc  10 mg daily, chlorthalidone  25 mg daily, and olmesartan  5 mg daily.  Lifestyle modifications strongly encouraged.  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  Elected to continue anticoagulation indefinitely given degree of clot burden at time of diagnosis.  Was followed by oncology, no recent follow-up.  Rx sent to local pharmacy.  Previously enrolled in prescription program with manufacturer requiring med be sent to specialty pharmacy.  Patient to contact clinic if med needs to be sent to that pharmacy.  Return in about 21 weeks (around 06/14/2024) for physical, blood pressure.   Clotilda JONELLE Single, MD

## 2024-02-26 ENCOUNTER — Other Ambulatory Visit: Payer: Self-pay | Admitting: Family Medicine

## 2024-02-26 DIAGNOSIS — Z86711 Personal history of pulmonary embolism: Secondary | ICD-10-CM

## 2024-05-26 ENCOUNTER — Other Ambulatory Visit: Payer: Self-pay | Admitting: Family Medicine

## 2024-05-26 DIAGNOSIS — I10 Essential (primary) hypertension: Secondary | ICD-10-CM

## 2024-05-26 NOTE — Telephone Encounter (Signed)
 Copied from CRM [id number]. Topic: Clinical - Medication Refill >> May 26, 2024 10:20 AM Aleatha C wrote: Medication:  amLODipine  (NORVASC ) 10 MG tablet   Has the patient contacted their pharmacy? Yes (Agent: If no, request that the patient contact the pharmacy for the refill. If patient does not wish to contact the pharmacy document the reason why and proceed with request.) (Agent: If yes, when and what did the pharmacy advise?)  This is the patient's preferred pharmacy:  [id number] [place], [place] - [address] AT [facility] [address] & [facility] [address] [place] [address] Phone: [phone] Fax: [phone]    Is this the correct pharmacy for this prescription? Yes If no, delete pharmacy and type the correct one.   Has the prescription been filled recently? No  Is the patient out of the medication? Yes  Has the patient been seen for an appointment in the last year OR does the patient have an upcoming appointment? Yes  Can we respond through MyChart? No  Agent: Please be advised that Rx refills may take up to 3 business days. We ask that you follow-up with your pharmacy.

## 2024-07-07 ENCOUNTER — Other Ambulatory Visit: Payer: Self-pay | Admitting: Family Medicine

## 2024-07-07 DIAGNOSIS — I10 Essential (primary) hypertension: Secondary | ICD-10-CM
